# Patient Record
Sex: Male | Born: 1960 | Race: White | Hispanic: No | Marital: Married | Smoking: Never smoker
Health system: Southern US, Community
[De-identification: ages and names within clinical notes are randomized; demographics above are authoritative.]

## PROBLEM LIST (undated history)

## (undated) DIAGNOSIS — R748 Abnormal levels of other serum enzymes: Secondary | ICD-10-CM

## (undated) DIAGNOSIS — C801 Malignant (primary) neoplasm, unspecified: Secondary | ICD-10-CM

## (undated) DIAGNOSIS — K219 Gastro-esophageal reflux disease without esophagitis: Secondary | ICD-10-CM

## (undated) DIAGNOSIS — Z86010 Personal history of colonic polyps: Secondary | ICD-10-CM

## (undated) DIAGNOSIS — Q859 Phakomatosis, unspecified: Secondary | ICD-10-CM

## (undated) DIAGNOSIS — J45909 Unspecified asthma, uncomplicated: Secondary | ICD-10-CM

## (undated) DIAGNOSIS — D131 Benign neoplasm of stomach: Secondary | ICD-10-CM

## (undated) HISTORY — PX: POLYPECTOMY: SHX149

## (undated) HISTORY — DX: Unspecified asthma, uncomplicated: J45.909

## (undated) HISTORY — DX: Malignant (primary) neoplasm, unspecified: C80.1

## (undated) HISTORY — PX: UPPER GASTROINTESTINAL ENDOSCOPY: SHX188

## (undated) HISTORY — DX: Gastro-esophageal reflux disease without esophagitis: K21.9

## (undated) HISTORY — DX: Personal history of colonic polyps: Z86.010

## (undated) HISTORY — DX: Phakomatosis, unspecified: Q85.9

## (undated) HISTORY — PX: COLONOSCOPY: SHX174

## (undated) HISTORY — DX: Abnormal levels of other serum enzymes: R74.8

## (undated) HISTORY — DX: Benign neoplasm of stomach: D13.1

---

## 2016-04-24 ENCOUNTER — Encounter: Payer: Self-pay | Admitting: Internal Medicine

## 2016-06-19 ENCOUNTER — Ambulatory Visit (AMBULATORY_SURGERY_CENTER): Payer: Self-pay | Admitting: *Deleted

## 2016-06-19 VITALS — Ht 67.0 in | Wt 187.0 lb

## 2016-06-19 DIAGNOSIS — Z1211 Encounter for screening for malignant neoplasm of colon: Secondary | ICD-10-CM

## 2016-06-19 NOTE — Progress Notes (Signed)
No egg or soy allergy known to patient  No issues with past sedation with any surgeries  or procedures, no intubation problems  No diet pills per patient No home 02 use per patient  No blood thinners per patient  Pt denies issues with constipation  No A fib or A flutter  EMMI video sent to pt's e mail  

## 2016-06-23 ENCOUNTER — Encounter: Payer: Self-pay | Admitting: Internal Medicine

## 2016-06-24 ENCOUNTER — Encounter: Payer: Self-pay | Admitting: Internal Medicine

## 2016-07-03 ENCOUNTER — Encounter: Payer: Self-pay | Admitting: Internal Medicine

## 2016-08-21 ENCOUNTER — Encounter: Payer: BLUE CROSS/BLUE SHIELD | Admitting: Internal Medicine

## 2016-10-01 ENCOUNTER — Ambulatory Visit (AMBULATORY_SURGERY_CENTER): Payer: BLUE CROSS/BLUE SHIELD | Admitting: Internal Medicine

## 2016-10-01 ENCOUNTER — Encounter: Payer: Self-pay | Admitting: Internal Medicine

## 2016-10-01 VITALS — BP 120/84 | HR 59 | Temp 98.0°F | Resp 12 | Ht 67.0 in | Wt 187.0 lb

## 2016-10-01 DIAGNOSIS — D123 Benign neoplasm of transverse colon: Secondary | ICD-10-CM

## 2016-10-01 DIAGNOSIS — D122 Benign neoplasm of ascending colon: Secondary | ICD-10-CM | POA: Diagnosis not present

## 2016-10-01 DIAGNOSIS — D124 Benign neoplasm of descending colon: Secondary | ICD-10-CM

## 2016-10-01 DIAGNOSIS — D125 Benign neoplasm of sigmoid colon: Secondary | ICD-10-CM

## 2016-10-01 DIAGNOSIS — K635 Polyp of colon: Secondary | ICD-10-CM

## 2016-10-01 DIAGNOSIS — Z1212 Encounter for screening for malignant neoplasm of rectum: Secondary | ICD-10-CM

## 2016-10-01 DIAGNOSIS — D120 Benign neoplasm of cecum: Secondary | ICD-10-CM | POA: Diagnosis not present

## 2016-10-01 DIAGNOSIS — Z1211 Encounter for screening for malignant neoplasm of colon: Secondary | ICD-10-CM | POA: Diagnosis not present

## 2016-10-01 DIAGNOSIS — Z860101 Personal history of adenomatous and serrated colon polyps: Secondary | ICD-10-CM

## 2016-10-01 DIAGNOSIS — Z86010 Personal history of colonic polyps: Secondary | ICD-10-CM

## 2016-10-01 HISTORY — DX: Personal history of colonic polyps: Z86.010

## 2016-10-01 HISTORY — DX: Personal history of adenomatous and serrated colon polyps: Z86.0101

## 2016-10-01 MED ORDER — SODIUM CHLORIDE 0.9 % IV SOLN: 500.0000 mL | INTRAVENOUS | Status: DC

## 2016-10-01 NOTE — Progress Notes (Signed)
A/ox3 pleased with MAC, report to [facility]

## 2016-10-01 NOTE — Op Note (Signed)
Ray Shelton: Ray Shelton Procedure Date: 10/01/2016 2:47 PM MRN: [id number] Endoscopist: Gatha Mayer , MD Age: 56 Referring MD:  Date of Birth: 11/08/1960 Gender: Male Account #: [id number] Procedure:                Colonoscopy Indications:              Screening for colorectal malignant neoplasm, This                            is the patient's first colonoscopy Medicines:                Propofol per Anesthesia, Monitored Anesthesia Care Procedure:                Pre-Anesthesia Assessment:                           - Prior to the procedure, a History and Physical                            was performed, and patient medications and                            allergies were reviewed. The patient's tolerance of                            previous anesthesia was also reviewed. The risks                            and benefits of the procedure and the sedation                            options and risks were discussed with the patient.                            All questions were answered, and informed consent                            was obtained. Prior Anticoagulants: The patient has                            taken no previous anticoagulant or antiplatelet                            agents. ASA Grade Assessment: II - A patient with                            mild systemic disease. After reviewing the risks                            and benefits, the patient was deemed in                            satisfactory condition to undergo the procedure.  After obtaining informed consent, the colonoscope                            was passed under direct vision. Throughout the                            procedure, the patient's blood pressure, pulse, and                            oxygen saturations were monitored continuously. The                            Colonoscope was introduced through the anus and   advanced to the the cecum, identified by                            appendiceal orifice and ileocecal valve. The                            colonoscopy was performed without difficulty. The                            patient tolerated the procedure well. The quality                            of the bowel preparation was excellent. The                            ileocecal valve, appendiceal orifice, and rectum                            were photographed. Scope In: 2:52:15 PM Scope Out: 3:15:48 PM Scope Withdrawal Time: 0 hours 20 minutes 24 seconds  Total Procedure Duration: 0 hours 23 minutes 33 seconds  Findings:                 The perianal and digital rectal examinations were                            normal. Pertinent negatives include normal prostate                            (size, shape, and consistency).                           A large polyp was found in the cecum. The polyp was                            sessile and Paris classification Is (protruding,                            sessile). Biopsies were taken with a cold forceps                            for histology. Verification of patient  identification for the specimen was done. Estimated                            blood loss was minimal.                           Two semi-pedunculated polyps were found in the                            sigmoid colon and transverse colon. The polyps were                            10 mm in size. These polyps were removed with a hot                            snare. Resection and retrieval were complete.                            Verification of patient identification for the                            specimen was done. Estimated blood loss: none.                           Six sessile polyps were found in the sigmoid colon,                            descending colon, transverse colon and ascending                            colon. The polyps were 3 to 8 mm  in size. These                            polyps were removed with a cold snare. Resection                            and retrieval were complete. Verification of                            patient identification for the specimen was done.                            Estimated blood loss was minimal.                           Multiple diverticula were found in the left colon.                           External and internal hemorrhoids were found during                            retroflexion. Complications:            No immediate complications. Estimated Blood Loss:  Estimated blood loss was minimal. Impression:               - One large polyp in the cecum. Biopsied. Photo                            omitted - 1/2 circumference - soft - slight erosion                            - appears benign but large                           - Two 10 mm polyps in the sigmoid colon and in the                            transverse colon, removed with a hot snare.                            Resected and retrieved.                           - Six 3 to 8 mm polyps in the sigmoid colon, in the                            descending colon, in the transverse colon and in                            the ascending colon, removed with a cold snare.                            Resected and retrieved.                           - Diverticulosis in the left colon.                           - External and internal hemorrhoids. Recommendation:           - Patient has a contact number available for                            emergencies. The signs and symptoms of potential                            delayed complications were discussed with the                            patient. Return to normal activities tomorrow.                            Written discharge instructions were provided to the                            patient.                           -  Resume previous diet.                           - Continue  present medications.                           - No aspirin, ibuprofen, naproxen, or other                            non-steroidal anti-inflammatory drugs for 2 weeks                            after polyp removal.                           Unless patholpgy tells otherwise i think reasonable                            to consider colonoscopic polypectomy of the largest                            polyp - cecum                           - Repeat colonoscopy is recommended. The                            colonoscopy date will be determined after pathology                            results from today's exam become available for                            review. Gatha Mayer, MD 10/01/2016 3:34:04 PM This report has been signed electronically.

## 2016-10-01 NOTE — Patient Instructions (Addendum)
I found 9 polyps - removed 8.  One is quite large - I took biopsies. I think it is benign - took some biopsies. It needs to come out - ? Is through surgery or by colonoscopy. I am thinking (unless biopsies say otherwise) that you should try to have it removed using the colonoscope. When biopsies are in I will call and explain things more.  You also have a condition called diverticulosis - common and not usually a problem. Please read the handout provided.  I appreciate the opportunity to care for you. Gatha Mayer, MD, [facility]   Discharge instructions given. Handouts on polyps,diverticulosis and hemorrhoids. Resume previous medications. No ibuprofen, naproxen, or other NSAIDs for two weeks. YOU HAD AN ENDOSCOPIC PROCEDURE TODAY AT [facility]:   Refer to the procedure report that was given to you for any specific questions about what was found during the examination.  If the procedure report does not answer your questions, please call your gastroenterologist to clarify.  If you requested that your care partner not be given the details of your procedure findings, then the procedure report has been included in a sealed envelope for you to review at your convenience later.  YOU SHOULD EXPECT: Some feelings of bloating in the abdomen. Passage of more gas than usual.  Walking can help get rid of the air that was put into your GI tract during the procedure and reduce the bloating. If you had a lower endoscopy (such as a colonoscopy or flexible sigmoidoscopy) you may notice spotting of blood in your stool or on the toilet paper. If you underwent a bowel prep for your procedure, you may not have a normal bowel movement for a few days.  Please Note:  You might notice some irritation and congestion in your nose or some drainage.  This is from the oxygen used during your procedure.  There is no need for concern and it should clear up in a day or so.  SYMPTOMS TO REPORT  IMMEDIATELY:   Following lower endoscopy (colonoscopy or flexible sigmoidoscopy):  Excessive amounts of blood in the stool  Significant tenderness or worsening of abdominal pains  Swelling of the abdomen that is new, acute  Fever of 100F or higher   For urgent or emergent issues, a gastroenterologist can be reached at any hour by calling [phone].   DIET:  We do recommend a small meal at first, but then you may proceed to your regular diet.  Drink plenty of fluids but you should avoid alcoholic beverages for 24 hours.  ACTIVITY:  You should plan to take it easy for the rest of today and you should NOT DRIVE or use heavy machinery until tomorrow (because of the sedation medicines used during the test).    FOLLOW UP: Our staff will call the number listed on your records the next business day following your procedure to check on you and address any questions or concerns that you may have regarding the information given to you following your procedure. If we do not reach you, we will leave a message.  However, if you are feeling well and you are not experiencing any problems, there is no need to return our call.  We will assume that you have returned to your regular daily activities without incident.  If any biopsies were taken you will be contacted by phone or by letter within the next 1-3 weeks.  Please call us at [phone] if you have  not heard about the biopsies in 3 weeks.    SIGNATURES/CONFIDENTIALITY: You and/or your care partner have signed paperwork which will be entered into your electronic medical record.  These signatures attest to the fact that that the information above on your After Visit Summary has been reviewed and is understood.  Full responsibility of the confidentiality of this discharge information lies with you and/or your care-partner.

## 2016-10-01 NOTE — Progress Notes (Signed)
Called to room to assist during endoscopic procedure.  Patient ID and intended procedure confirmed with present staff. Received instructions for my participation in the procedure from the performing physician.  

## 2016-10-02 ENCOUNTER — Telehealth: Payer: Self-pay | Admitting: *Deleted

## 2016-10-02 NOTE — Telephone Encounter (Signed)
  Follow up Call-  Call back number 10/01/2016  Post procedure Call Back phone  # [phone]  Permission to leave phone message Yes     Patient questions:  Do you have a fever, pain , or abdominal swelling? No. Pain Score  0 *  Have you tolerated food without any problems? Yes.    Have you been able to return to your normal activities? Yes.    Do you have any questions about your discharge instructions: Diet   No. Medications  No. Follow up visit  No.  Do you have questions or concerns about your Care? No.  Actions: * If pain score is 4 or above: No action needed, pain <4.

## 2016-10-10 ENCOUNTER — Telehealth: Payer: Self-pay | Admitting: Internal Medicine

## 2016-10-10 NOTE — Telephone Encounter (Signed)
Patient and wife called back to discuss pathology results and again.  All questions answered.   They have no surgical preference.  Please advise

## 2016-10-10 NOTE — Telephone Encounter (Signed)
Referral faxed to [facility].  Patient aware that they will contact him directly

## 2016-10-10 NOTE — Telephone Encounter (Signed)
Refer to Dr. Greer Pickerel please

## 2016-10-10 NOTE — Telephone Encounter (Signed)
Patient calling back to discuss results from colon on 8.22.18.

## 2016-10-14 NOTE — Telephone Encounter (Signed)
Patient has been scheduled with Dr. Redmond Pulling for 10/31/16 9:00

## 2016-10-31 NOTE — Patient Instructions (Addendum)
Ray Shelton.  10/31/2016   Your procedure is scheduled on: 11/04/2016    Report to [facility] Main  Entrance Take [place]  elevators to 3rd floor to  [place] at    0800 AM.     Call this number if you have problems the morning of surgery [phone]    Remember: ONLY 1 PERSON MAY GO WITH YOU TO SHORT STAY TO GET  READY MORNING OF [place].  Do not eat food or drink liquids :After Midnight.     Take these medicines the morning of surgery with A SIP OF WATER: prevacid                                 You may not have any metal on your body including hair pins and              piercings  Do not wear jewelry,  lotions, powders or perfumes, deodorant                          Men may shave face and neck.   Do not bring valuables to the hospital. [place].  Contacts, dentures or bridgework may not be worn into surgery.  Leave suitcase in the car. After surgery it may be brought to your room.                       Please read over the following fact sheets you were given: _____________________________________________________________________             [facility] - Preparing for Surgery Before surgery, you can play an important role.  Because skin is not sterile, your skin needs to be as free of germs as possible.  You can reduce the number of germs on your skin by washing with CHG (chlorahexidine gluconate) soap before surgery.  CHG is an antiseptic cleaner which kills germs and bonds with the skin to continue killing germs even after washing. Please DO NOT use if you have an allergy to CHG or antibacterial soaps.  If your skin becomes reddened/irritated stop using the CHG and inform your nurse when you arrive at Short Stay. Do not shave (including legs and underarms) for at least 48 hours prior to the first CHG shower.  You may shave your face/neck. Please follow these instructions  carefully:  1.  Shower with CHG Soap the night before surgery and the  morning of Surgery.  2.  If you choose to wash your hair, wash your hair first as usual with your  normal  shampoo.  3.  After you shampoo, rinse your hair and body thoroughly to remove the  shampoo.                           4.  Use CHG as you would any other liquid soap.  You can apply chg directly  to the skin and wash                       Gently with a scrungie or clean washcloth.  5.  Apply the CHG  Soap to your body ONLY FROM THE NECK DOWN.   Do not use on face/ open                           Wound or open sores. Avoid contact with eyes, ears mouth and genitals (private parts).                       Wash face,  Genitals (private parts) with your normal soap.             6.  Wash thoroughly, paying special attention to the area where your surgery  will be performed.  7.  Thoroughly rinse your body with warm water from the neck down.  8.  DO NOT shower/wash with your normal soap after using and rinsing off  the CHG Soap.                9.  Pat yourself dry with a clean towel.            10.  Wear clean pajamas.            11.  Place clean sheets on your bed the night of your first shower and do not  sleep with pets. Day of Surgery : Do not apply any lotions/deodorants the morning of surgery.  Please wear clean clothes to the hospital/surgery center.  FAILURE TO FOLLOW THESE INSTRUCTIONS MAY RESULT IN THE CANCELLATION OF YOUR SURGERY PATIENT SIGNATURE_________________________________  NURSE SIGNATURE__________________________________  ________________________________________________________________________

## 2016-11-03 ENCOUNTER — Encounter (HOSPITAL_COMMUNITY)
Admission: RE | Admit: 2016-11-03 | Discharge: 2016-11-03 | Disposition: A | Discharge status: Home / Self Care | Payer: BLUE CROSS/BLUE SHIELD | Source: Ambulatory Visit | Attending: General Surgery | Admitting: General Surgery

## 2016-11-03 ENCOUNTER — Encounter (HOSPITAL_COMMUNITY): Payer: Self-pay

## 2016-11-03 ENCOUNTER — Encounter (INDEPENDENT_AMBULATORY_CARE_PROVIDER_SITE_OTHER): Payer: Self-pay

## 2016-11-03 ENCOUNTER — Ambulatory Visit: Payer: Self-pay | Admitting: General Surgery

## 2016-11-03 DIAGNOSIS — Z01812 Encounter for preprocedural laboratory examination: Secondary | ICD-10-CM

## 2016-11-03 LAB — DIFFERENTIAL
Basophils Absolute: 0 10*3/uL (ref 0.0–0.1)
Basophils Relative: 1 %
EOS PCT: 2 %
Eosinophils Absolute: 0.1 10*3/uL (ref 0.0–0.7)
LYMPHS PCT: 30 %
Lymphs Abs: 2.5 10*3/uL (ref 0.7–4.0)
MONO ABS: 0.6 10*3/uL (ref 0.1–1.0)
Monocytes Relative: 8 %
NEUTROS ABS: 5.1 10*3/uL (ref 1.7–7.7)
Neutrophils Relative %: 61 %

## 2016-11-03 LAB — CBC
HEMATOCRIT: 49.6 % (ref 39.0–52.0)
HEMOGLOBIN: 16.7 g/dL (ref 13.0–17.0)
MCH: 30.3 pg (ref 26.0–34.0)
MCHC: 33.7 g/dL (ref 30.0–36.0)
MCV: 90 fL (ref 78.0–100.0)
Platelets: 263 10*3/uL (ref 150–400)
RBC: 5.51 MIL/uL (ref 4.22–5.81)
RDW: 13.5 % (ref 11.5–15.5)
WBC: 8.9 10*3/uL (ref 4.0–10.5)

## 2016-11-03 LAB — ABO/RH: ABO/RH(D): A POS

## 2016-11-03 NOTE — Progress Notes (Signed)
05/28/16-LOV- DR Orpah Greek on chart  EKG-05/28/16-on chart- Dr Orpah Greek  01/24/16-Stress Test - on chart- Dr Sabra Heck  ECHO-01/24/16- Dr Sabra Heck on chart

## 2016-11-03 NOTE — H&P (Signed)
Ray Shelton. 10/31/2016 9:07 AM Location: [facility] Patient #: [id number] DOB: 18-Mar-1960 Married / Language: [name] / Race: White Male   History of Present Illness Ray Hiss M. Ray Girten MD; 10/31/2016 10:55 AM) The patient is a 57 year old male who presents with a colonic polyp. He is referred by Dr Carlean Purl for evaluation of a cecal polyp. He states that he was undergoing routine screening colonoscopy around the end of August and he was found to have multiple polyps. All of which were able to be removed except for one in the beginning portion of his colon. All of the polyps were tubular adenomas with the exception of a cecal polyp mass. He was found to have a sessile polyp. 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 He is accompanied by his wife. He denies any abdominal pain. He denies any nausea, vomiting, diarrhea or constipation. He denies any melena or hematochezia. He denies any him plan weight loss. He denies any family history of cancers. He denies any chest pain, chest pressure, shortness of breath, orthopnea, paroxysmal nocturnal dyspnea, TIAs or amaurosis fugax. He denies smoking. He does drink about 2 beers per day.   Problem List/Past Medical Ray Hiss M. 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 Redmond Pulling, MD; 10/31/2016 10:56 AM) [name], Oral) Active. Medications Reconciled Neomycin Sulfate (500MG  Tablet, 2 (two) Tablet Oral SEE NOTE, Taken starting 10/31/2016) Active. (TAKE TWO TABLETS AT 2 PM, 3 PM, AND 10 PM THE DAY PRIOR TO SURGERY) [name], 2 (two) Tablet Oral SEE NOTE, Taken starting 10/31/2016) Active. (Take at 2pm, 3pm, and 10pm the day prior to your colon operation)  Social History Ray Shelton, RMA; 10/31/2016 9:07 AM) Alcohol use  Moderate alcohol use. Caffeine use  Coffee. No drug use  Tobacco use  Never smoker.  Family History Ray Shelton, [place]; 10/31/2016 9:07 AM) Alcohol Abuse  Son. Diabetes Mellitus  Mother. Heart Disease  Father. Prostate Cancer  Father.  Other Problems Ray Hiss M. Redmond Pulling, MD; 10/31/2016 10:56 AM) Gastroesophageal Reflux Disease     Review of Systems Ray Shelton RMA; 10/31/2016 9:07 AM) General Not Present- Appetite Loss, Chills, Fatigue, Fever, Night Sweats, Weight Gain and Weight Loss. Skin Not Present- Change in Wart/Mole, Dryness, Hives, Jaundice, New Lesions, Non-Healing Wounds, Rash and Ulcer. HEENT Present- Ringing in the Ears and Wears glasses/contact lenses. Not Present- Earache, Hearing Loss, Hoarseness, Nose Bleed, Oral Ulcers, Seasonal Allergies, Sinus Pain, Sore Throat, Visual Disturbances and Yellow Eyes. Respiratory Present- Snoring. Not Present- Bloody sputum, Chronic Cough, Difficulty Breathing and Wheezing. Breast Not Present- Breast Mass, Breast Pain, Nipple Discharge and Skin Changes. Cardiovascular Not Present- Chest Pain, Difficulty Breathing Lying Down, Leg Cramps, Palpitations, Rapid Heart Rate, Shortness of Breath and Swelling of Extremities. Gastrointestinal Not Present- Abdominal Pain, Bloating, Bloody Stool, Change in Bowel Habits, Chronic diarrhea, Constipation, Difficulty Swallowing, Excessive gas, Gets full quickly at meals, Hemorrhoids, Indigestion, Nausea, Rectal Pain and Vomiting. Male Genitourinary Not Present- Blood in Urine, Change in Urinary Stream, Frequency, Impotence, Nocturia, Painful Urination, Urgency and Urine Leakage. Musculoskeletal Not Present- Back Pain, Joint Pain, Joint Stiffness, Muscle Pain, Muscle Weakness and  Swelling of Extremities. Neurological Not  Present- Decreased Memory, Fainting, Headaches, Numbness, Seizures, Tingling, Tremor, Trouble walking and Weakness. Psychiatric Not Present- Anxiety, Bipolar, Change in Sleep Pattern, Depression, Fearful and Frequent crying. Endocrine Not Present- Cold Intolerance, Excessive Hunger, Hair Changes, Heat Intolerance, Hot flashes and New Diabetes. Hematology Not Present- Blood Thinners, Easy Bruising, Excessive bleeding, Gland problems, HIV and Persistent Infections.  Vitals Ray Shelton RMA; 10/31/2016 9:09 AM) 10/31/2016 9:08 AM Weight: 188.8 lb Height: 67in Body Surface Area: 1.97 m Body Mass Index: 29.57 kg/m  Temp.: 97.36F  Pulse: 69 (Regular)  BP: 130/90 (Sitting, Left Arm, Standard)       Physical Exam Ray Hiss M. Donneisha Beane MD; 10/31/2016 10:53 AM) General Mental Status-Alert. General Appearance-Consistent with stated age. Hydration-Well hydrated. Voice-Normal. Note: overweight   Head and Neck Head-normocephalic, atraumatic with no lesions or palpable masses. Trachea-midline. Thyroid Gland Characteristics - normal size and consistency.  Eye Eyeball - Bilateral-Extraocular movements intact. Sclera/Conjunctiva - Bilateral-No scleral icterus.  ENMT Ears -Note: normal external ears.  Mouth and Throat -Note: lips intact.   Chest and Lung Exam Chest and lung exam reveals -quiet, even and easy respiratory effort with no use of accessory muscles and on auscultation, normal breath sounds, no adventitious sounds and normal vocal resonance. Inspection Chest Wall - Normal. Back - normal.  Breast - Did not examine.  Cardiovascular Cardiovascular examination reveals -normal heart sounds, regular rate and rhythm with no murmurs and normal pedal pulses bilaterally.  Abdomen Inspection Inspection of the abdomen reveals - No Hernias. Skin - Scar - no surgical scars. Palpation/Percussion Palpation  and Percussion of the abdomen reveal - Soft, Non Tender, No Rebound tenderness, No Rigidity (guarding) and No hepatosplenomegaly. Auscultation Auscultation of the abdomen reveals - Bowel sounds normal.  Peripheral Vascular Upper Extremity Palpation - Pulses bilaterally normal.  Neurologic Neurologic evaluation reveals -alert and oriented x 3 with no impairment of recent or remote memory. Mental Status-Normal.  Neuropsychiatric The patient's mood and affect are described as -normal. Judgment and Insight-insight is appropriate concerning matters relevant to self.  Musculoskeletal Normal Exam - Left-Upper Extremity Strength Normal and Lower Extremity Strength Normal. Normal Exam - Right-Upper Extremity Strength Normal and Lower Extremity Strength Normal.  Lymphatic Head & Neck  General Head & Neck Lymphatics: Bilateral - Description - Normal. Axillary - Did not examine. Femoral & Inguinal - Did not examine.    Assessment & Plan Ray Hiss M. 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 I agree with Dr. Carlean Purl that he needs dissection of his colon removed based on the pathology report. We discussed colonic polyps and their progression to malignancy. We discussed that there may be in fact some cancer in this polyp but cannot say for sure until it is completely surgically removed.  I recommended a laparoscopic partial colectomy.  I discussed the procedure in detail. The patient was given Neurosurgeon. We discussed the risks and benefits of surgery including, but not limited to bleeding, infection (such as wound infection, abdominal abscess), injury to surrounding structures, blood clot formation, urinary retention, incisional hernia, anastomotic stricture, anastomotic leak, anesthesia risks, pulmonary & cardiac complications such as pneumonia &/or heart attack, need for additional  procedures, ileus, & prolonged hospitalization. We discussed the typical postoperative recovery course, including limitations & restrictions postoperatively. I explained that the likelihood of improvement in their symptoms is good.  The patient has elected to proceed with surgery.  ERAS protocol Current Plans Pt Education - Pamphlet Given - Laparoscopic  Colorectal Surgery: discussed with patient and provided information. Started Neomycin Sulfate 500MG , 2 (two) Tablet SEE NOTE, #6, 10/31/2016, No Refill. Local Order: TAKE TWO TABLETS AT 2 PM, 3 PM, AND 10 PM THE DAY PRIOR TO SURGERY Started Flagyl 500MG , 2 (two) Tablet SEE NOTE, #6, 10/31/2016, No Refill. Local Order: Take at 2pm, 3pm, and 10pm the day prior to your colon operation   Leighton Ruff. Redmond Pulling, MD, FACS General, Bariatric, & Minimally Invasive Surgery [facility], [place]

## 2016-11-04 ENCOUNTER — Encounter (HOSPITAL_COMMUNITY): Payer: Self-pay | Admitting: Registered Nurse

## 2016-11-04 ENCOUNTER — Inpatient Hospital Stay (HOSPITAL_COMMUNITY): Payer: BLUE CROSS/BLUE SHIELD | Admitting: Registered Nurse

## 2016-11-04 ENCOUNTER — Encounter (HOSPITAL_COMMUNITY)
Admission: RE | Disposition: A | Discharge status: Home / Self Care | Payer: Self-pay | Source: Ambulatory Visit | Attending: General Surgery

## 2016-11-04 ENCOUNTER — Inpatient Hospital Stay (HOSPITAL_COMMUNITY)
Admission: RE | Admit: 2016-11-04 | Discharge: 2016-11-07 | DRG: 331 | Disposition: A | Discharge status: Home / Self Care | Payer: BLUE CROSS/BLUE SHIELD | Source: Ambulatory Visit | Attending: General Surgery | Admitting: General Surgery

## 2016-11-04 DIAGNOSIS — K219 Gastro-esophageal reflux disease without esophagitis: Secondary | ICD-10-CM | POA: Diagnosis present

## 2016-11-04 DIAGNOSIS — Z811 Family history of alcohol abuse and dependence: Secondary | ICD-10-CM

## 2016-11-04 DIAGNOSIS — Z8249 Family history of ischemic heart disease and other diseases of the circulatory system: Secondary | ICD-10-CM | POA: Diagnosis not present

## 2016-11-04 DIAGNOSIS — E663 Overweight: Secondary | ICD-10-CM | POA: Diagnosis present

## 2016-11-04 DIAGNOSIS — Z833 Family history of diabetes mellitus: Secondary | ICD-10-CM | POA: Diagnosis not present

## 2016-11-04 DIAGNOSIS — Z6829 Body mass index (BMI) 29.0-29.9, adult: Secondary | ICD-10-CM

## 2016-11-04 DIAGNOSIS — Z8042 Family history of malignant neoplasm of prostate: Secondary | ICD-10-CM

## 2016-11-04 DIAGNOSIS — K6389 Other specified diseases of intestine: Secondary | ICD-10-CM | POA: Diagnosis present

## 2016-11-04 DIAGNOSIS — K635 Polyp of colon: Principal | ICD-10-CM | POA: Diagnosis present

## 2016-11-04 HISTORY — PX: LAPAROSCOPIC PARTIAL COLECTOMY: SHX5907

## 2016-11-04 LAB — COMPREHENSIVE METABOLIC PANEL
ALK PHOS: 41 U/L (ref 38–126)
ALT: 49 U/L (ref 17–63)
ANION GAP: 12 (ref 5–15)
AST: 44 U/L — ABNORMAL HIGH (ref 15–41)
Albumin: 4.7 g/dL (ref 3.5–5.0)
BUN: 13 mg/dL (ref 6–20)
CALCIUM: 9.6 mg/dL (ref 8.9–10.3)
CO2: 26 mmol/L (ref 22–32)
CREATININE: 1.14 mg/dL (ref 0.61–1.24)
Chloride: 99 mmol/L — ABNORMAL LOW (ref 101–111)
Glucose, Bld: 105 mg/dL — ABNORMAL HIGH (ref 65–99)
Potassium: 4.5 mmol/L (ref 3.5–5.1)
Sodium: 137 mmol/L (ref 135–145)
TOTAL PROTEIN: 8.2 g/dL — AB (ref 6.5–8.1)
Total Bilirubin: 1.3 mg/dL — ABNORMAL HIGH (ref 0.3–1.2)

## 2016-11-04 LAB — TYPE AND SCREEN
ABO/RH(D): A POS
Antibody Screen: NEGATIVE

## 2016-11-04 LAB — HEMOGLOBIN A1C
HEMOGLOBIN A1C: 5.6 % (ref 4.8–5.6)
Mean Plasma Glucose: 114.02 mg/dL

## 2016-11-04 SURGERY — LAPAROSCOPIC PARTIAL COLECTOMY
Anesthesia: General | Site: Abdomen

## 2016-11-04 MED ORDER — SUGAMMADEX SODIUM 200 MG/2ML IV SOLN
INTRAVENOUS | Status: AC
  Filled 2016-11-04: qty 2

## 2016-11-04 MED ORDER — BUPIVACAINE LIPOSOME 1.3 % IJ SUSP
20.0000 mL | Freq: Once | INTRAMUSCULAR | Status: AC
  Administered 2016-11-04: 20 mL
  Filled 2016-11-04: qty 20

## 2016-11-04 MED ORDER — PROMETHAZINE HCL 25 MG/ML IJ SOLN
12.5000 mg | Freq: Four times a day (QID) | INTRAMUSCULAR | Status: DC | PRN
  Administered 2016-11-04: 12.5 mg via INTRAVENOUS
  Filled 2016-11-04: qty 1

## 2016-11-04 MED ORDER — CHLORHEXIDINE GLUCONATE 4 % EX LIQD: 60.0000 mL | Freq: Once | CUTANEOUS | Status: DC

## 2016-11-04 MED ORDER — GABAPENTIN 300 MG PO CAPS
300.0000 mg | ORAL_CAPSULE | ORAL | Status: AC
Start: 2016-11-04 — End: 2016-11-04
  Administered 2016-11-04: 300 mg via ORAL
  Filled 2016-11-04: qty 1

## 2016-11-04 MED ORDER — ONDANSETRON HCL 4 MG/2ML IJ SOLN
INTRAMUSCULAR | Status: AC
  Filled 2016-11-04: qty 2

## 2016-11-04 MED ORDER — PROPOFOL 10 MG/ML IV BOLUS
INTRAVENOUS | Status: DC | PRN
  Administered 2016-11-04: 160 mg via INTRAVENOUS

## 2016-11-04 MED ORDER — BUPIVACAINE-EPINEPHRINE 0.25% -1:200000 IJ SOLN
INTRAMUSCULAR | Status: DC | PRN
  Administered 2016-11-04: 30 mL

## 2016-11-04 MED ORDER — LIDOCAINE 2% (20 MG/ML) 5 ML SYRINGE
INTRAMUSCULAR | Status: DC | PRN
  Administered 2016-11-04: 60 mg via INTRAVENOUS

## 2016-11-04 MED ORDER — ONDANSETRON HCL 4 MG/2ML IJ SOLN
INTRAMUSCULAR | Status: DC | PRN
  Administered 2016-11-04: 4 mg via INTRAVENOUS

## 2016-11-04 MED ORDER — DEXAMETHASONE SODIUM PHOSPHATE 10 MG/ML IJ SOLN
INTRAMUSCULAR | Status: DC | PRN
  Administered 2016-11-04: 8 mg via INTRAVENOUS

## 2016-11-04 MED ORDER — ONDANSETRON HCL 4 MG/2ML IJ SOLN: 4.0000 mg | Freq: Four times a day (QID) | INTRAMUSCULAR | Status: DC | PRN

## 2016-11-04 MED ORDER — PROPOFOL 10 MG/ML IV BOLUS
INTRAVENOUS | Status: AC
  Filled 2016-11-04: qty 20

## 2016-11-04 MED ORDER — KETAMINE HCL 10 MG/ML IJ SOLN
INTRAMUSCULAR | Status: DC | PRN
  Administered 2016-11-04: 35 mg via INTRAVENOUS
  Administered 2016-11-04 (×2): 10 mg via INTRAVENOUS

## 2016-11-04 MED ORDER — MORPHINE SULFATE (PF) 4 MG/ML IV SOLN
1.0000 mg | INTRAVENOUS | Status: DC | PRN
  Administered 2016-11-04: 2 mg via INTRAVENOUS
  Administered 2016-11-05 – 2016-11-06 (×3): 3 mg via INTRAVENOUS
  Filled 2016-11-04 (×4): qty 1

## 2016-11-04 MED ORDER — LIDOCAINE 2% (20 MG/ML) 5 ML SYRINGE
INTRAMUSCULAR | Status: AC
  Filled 2016-11-04: qty 5

## 2016-11-04 MED ORDER — SCOPOLAMINE 1 MG/3DAYS TD PT72
1.0000 | MEDICATED_PATCH | TRANSDERMAL | Status: DC
  Administered 2016-11-04: 1.5 mg via TRANSDERMAL
  Filled 2016-11-04: qty 1

## 2016-11-04 MED ORDER — PROMETHAZINE HCL 25 MG/ML IJ SOLN: 6.2500 mg | INTRAMUSCULAR | Status: DC | PRN

## 2016-11-04 MED ORDER — GABAPENTIN 300 MG PO CAPS
300.0000 mg | ORAL_CAPSULE | Freq: Two times a day (BID) | ORAL | Status: DC
  Administered 2016-11-04 – 2016-11-07 (×6): 300 mg via ORAL
  Filled 2016-11-04 (×6): qty 1

## 2016-11-04 MED ORDER — PANTOPRAZOLE SODIUM 40 MG IV SOLR
40.0000 mg | INTRAVENOUS | Status: DC
  Administered 2016-11-04 – 2016-11-05 (×2): 40 mg via INTRAVENOUS
  Filled 2016-11-04 (×2): qty 40

## 2016-11-04 MED ORDER — ROCURONIUM BROMIDE 50 MG/5ML IV SOSY
PREFILLED_SYRINGE | INTRAVENOUS | Status: AC
  Filled 2016-11-04: qty 5

## 2016-11-04 MED ORDER — MIDAZOLAM HCL 2 MG/2ML IJ SOLN
INTRAMUSCULAR | Status: AC
  Filled 2016-11-04: qty 2

## 2016-11-04 MED ORDER — DEXAMETHASONE SODIUM PHOSPHATE 10 MG/ML IJ SOLN
INTRAMUSCULAR | Status: AC
  Filled 2016-11-04: qty 1

## 2016-11-04 MED ORDER — LIDOCAINE HCL 2 % IJ SOLN
INTRAMUSCULAR | Status: AC
  Filled 2016-11-04: qty 40

## 2016-11-04 MED ORDER — SODIUM CHLORIDE 0.9 % IJ SOLN
INTRAMUSCULAR | Status: AC
  Filled 2016-11-04: qty 50

## 2016-11-04 MED ORDER — CEFOTETAN DISODIUM-DEXTROSE 2-2.08 GM-% IV SOLR
2.0000 g | INTRAVENOUS | Status: AC
  Administered 2016-11-04: 2 g via INTRAVENOUS
  Filled 2016-11-04: qty 50

## 2016-11-04 MED ORDER — LIDOCAINE 2% (20 MG/ML) 5 ML SYRINGE
INTRAMUSCULAR | Status: DC | PRN
  Administered 2016-11-04: 1.5 mg/kg/h via INTRAVENOUS

## 2016-11-04 MED ORDER — FENTANYL CITRATE (PF) 250 MCG/5ML IJ SOLN
INTRAMUSCULAR | Status: AC
  Filled 2016-11-04: qty 5

## 2016-11-04 MED ORDER — ACETAMINOPHEN 500 MG PO TABS
1000.0000 mg | ORAL_TABLET | Freq: Four times a day (QID) | ORAL | Status: AC
  Administered 2016-11-04 – 2016-11-05 (×4): 1000 mg via ORAL
  Filled 2016-11-04 (×4): qty 2

## 2016-11-04 MED ORDER — KETAMINE HCL 10 MG/ML IJ SOLN
INTRAMUSCULAR | Status: AC
  Filled 2016-11-04: qty 1

## 2016-11-04 MED ORDER — MIDAZOLAM HCL 5 MG/5ML IJ SOLN
INTRAMUSCULAR | Status: DC | PRN
  Administered 2016-11-04: 2 mg via INTRAVENOUS

## 2016-11-04 MED ORDER — SUGAMMADEX SODIUM 200 MG/2ML IV SOLN
INTRAVENOUS | Status: DC | PRN
  Administered 2016-11-04: 200 mg via INTRAVENOUS

## 2016-11-04 MED ORDER — ONDANSETRON HCL 4 MG PO TABS: 4.0000 mg | ORAL_TABLET | Freq: Four times a day (QID) | ORAL | Status: DC | PRN

## 2016-11-04 MED ORDER — SODIUM CHLORIDE 0.9 % IJ SOLN
INTRAMUSCULAR | Status: DC | PRN
  Administered 2016-11-04: 50 mL via INTRAVENOUS

## 2016-11-04 MED ORDER — HYDROMORPHONE HCL-NACL 0.5-0.9 MG/ML-% IV SOSY
PREFILLED_SYRINGE | INTRAVENOUS | Status: AC
Start: 2016-11-04 — End: 2016-11-05
  Filled 2016-11-04: qty 3

## 2016-11-04 MED ORDER — LIDOCAINE 2% (20 MG/ML) 5 ML SYRINGE
INTRAMUSCULAR | Status: AC
  Filled 2016-11-04: qty 10

## 2016-11-04 MED ORDER — LABETALOL HCL 5 MG/ML IV SOLN
INTRAVENOUS | Status: AC
  Filled 2016-11-04: qty 4

## 2016-11-04 MED ORDER — ALVIMOPAN 12 MG PO CAPS
12.0000 mg | ORAL_CAPSULE | ORAL | Status: AC
  Administered 2016-11-04: 12 mg via ORAL
  Filled 2016-11-04: qty 1

## 2016-11-04 MED ORDER — KCL IN DEXTROSE-NACL 20-5-0.45 MEQ/L-%-% IV SOLN
INTRAVENOUS | Status: DC
  Administered 2016-11-04 – 2016-11-05 (×2): via INTRAVENOUS
  Filled 2016-11-04 (×3): qty 1000

## 2016-11-04 MED ORDER — ENOXAPARIN SODIUM 40 MG/0.4ML ~~LOC~~ SOLN
40.0000 mg | SUBCUTANEOUS | Status: DC
  Administered 2016-11-05 – 2016-11-07 (×2): 40 mg via SUBCUTANEOUS
  Filled 2016-11-04 (×3): qty 0.4

## 2016-11-04 MED ORDER — ACETAMINOPHEN 500 MG PO TABS
1000.0000 mg | ORAL_TABLET | ORAL | Status: AC
  Administered 2016-11-04: 1000 mg via ORAL
  Filled 2016-11-04: qty 2

## 2016-11-04 MED ORDER — OXYCODONE HCL 5 MG PO TABS
5.0000 mg | ORAL_TABLET | ORAL | Status: DC | PRN
  Administered 2016-11-07: 10 mg via ORAL
  Filled 2016-11-04: qty 2

## 2016-11-04 MED ORDER — 0.9 % SODIUM CHLORIDE (POUR BTL) OPTIME
TOPICAL | Status: DC | PRN
  Administered 2016-11-04: 4000 mL

## 2016-11-04 MED ORDER — DIPHENHYDRAMINE HCL 50 MG/ML IJ SOLN: 12.5000 mg | Freq: Four times a day (QID) | INTRAMUSCULAR | Status: DC | PRN

## 2016-11-04 MED ORDER — DIPHENHYDRAMINE HCL 12.5 MG/5ML PO ELIX: 12.5000 mg | ORAL_SOLUTION | Freq: Four times a day (QID) | ORAL | Status: DC | PRN

## 2016-11-04 MED ORDER — LACTATED RINGERS IV SOLN
INTRAVENOUS | Status: DC
  Administered 2016-11-04 (×3): via INTRAVENOUS

## 2016-11-04 MED ORDER — BUPIVACAINE-EPINEPHRINE (PF) 0.25% -1:200000 IJ SOLN
INTRAMUSCULAR | Status: AC
Start: 2016-11-04 — End: 2016-11-04
  Filled 2016-11-04: qty 30

## 2016-11-04 MED ORDER — LABETALOL HCL 5 MG/ML IV SOLN
INTRAVENOUS | Status: DC | PRN
  Administered 2016-11-04: 5 mg via INTRAVENOUS

## 2016-11-04 MED ORDER — ROCURONIUM BROMIDE 10 MG/ML (PF) SYRINGE
PREFILLED_SYRINGE | INTRAVENOUS | Status: DC | PRN
  Administered 2016-11-04: 50 mg via INTRAVENOUS
  Administered 2016-11-04 (×3): 20 mg via INTRAVENOUS
  Administered 2016-11-04: 10 mg via INTRAVENOUS

## 2016-11-04 MED ORDER — SODIUM CHLORIDE 0.9 % IV SOLN
INTRAVENOUS | Status: AC
  Filled 2016-11-04: qty 500000

## 2016-11-04 MED ORDER — ALVIMOPAN 12 MG PO CAPS
12.0000 mg | ORAL_CAPSULE | Freq: Two times a day (BID) | ORAL | Status: DC
  Administered 2016-11-05: 12 mg via ORAL
  Filled 2016-11-04: qty 1

## 2016-11-04 MED ORDER — HEPARIN SODIUM (PORCINE) 5000 UNIT/ML IJ SOLN
5000.0000 [IU] | Freq: Once | INTRAMUSCULAR | Status: AC
  Administered 2016-11-04: 5000 [IU] via SUBCUTANEOUS
  Filled 2016-11-04: qty 1

## 2016-11-04 MED ORDER — HYDROMORPHONE HCL-NACL 0.5-0.9 MG/ML-% IV SOSY
0.2500 mg | PREFILLED_SYRINGE | INTRAVENOUS | Status: DC | PRN
  Administered 2016-11-04 (×3): 0.5 mg via INTRAVENOUS

## 2016-11-04 MED ORDER — FENTANYL CITRATE (PF) 250 MCG/5ML IJ SOLN
INTRAMUSCULAR | Status: DC | PRN
  Administered 2016-11-04 (×2): 50 ug via INTRAVENOUS
  Administered 2016-11-04: 100 ug via INTRAVENOUS
  Administered 2016-11-04: 50 ug via INTRAVENOUS

## 2016-11-04 MED ORDER — LACTATED RINGERS IR SOLN
Status: DC | PRN
  Administered 2016-11-04: 2000 mL

## 2016-11-04 MED ORDER — SODIUM CHLORIDE 0.9 % IV SOLN
INTRAVENOUS | Status: DC | PRN
  Administered 2016-11-04: 500 mL

## 2016-11-04 SURGICAL SUPPLY — 63 items
APPLIER CLIP 5 13 M/L LIGAMAX5 (MISCELLANEOUS)
APPLIER CLIP ROT 10 11.4 M/L (STAPLE)
BLADE EXTENDED COATED 6.5IN (ELECTRODE) IMPLANT
CELLS DAT CNTRL 66122 CELL SVR (MISCELLANEOUS) IMPLANT
CHLORAPREP W/TINT 26ML (MISCELLANEOUS) IMPLANT
CLIP APPLIE 5 13 M/L LIGAMAX5 (MISCELLANEOUS) IMPLANT
CLIP APPLIE ROT 10 11.4 M/L (STAPLE) IMPLANT
COUNTER NEEDLE 20 DBL MAG RED (NEEDLE) IMPLANT
COVER MAYO STAND STRL (DRAPES) IMPLANT
DERMABOND ADVANCED (GAUZE/BANDAGES/DRESSINGS) ×2
DERMABOND ADVANCED .7 DNX12 (GAUZE/BANDAGES/DRESSINGS) ×1 IMPLANT
DRAIN CHANNEL 19F RND (DRAIN) IMPLANT
DRAPE LAPAROSCOPIC ABDOMINAL (DRAPES) ×3 IMPLANT
DRSG OPSITE POSTOP 4X6 (GAUZE/BANDAGES/DRESSINGS) ×3 IMPLANT
DRSG OPSITE POSTOP 4X8 (GAUZE/BANDAGES/DRESSINGS) IMPLANT
DRSG TELFA 3X8 NADH (GAUZE/BANDAGES/DRESSINGS) IMPLANT
ELECT PENCIL ROCKER SW 15FT (MISCELLANEOUS) ×3 IMPLANT
ELECT REM PT RETURN 15FT ADLT (MISCELLANEOUS) ×3 IMPLANT
EVACUATOR SILICONE 100CC (DRAIN) IMPLANT
GAUZE SPONGE 4X4 12PLY STRL (GAUZE/BANDAGES/DRESSINGS) IMPLANT
GLOVE BIO SURGEON STRL SZ7.5 (GLOVE) ×6 IMPLANT
GLOVE INDICATOR 8.0 STRL GRN (GLOVE) ×6 IMPLANT
GOWN STRL REUS W/TWL XL LVL3 (GOWN DISPOSABLE) ×24 IMPLANT
LEGGING LITHOTOMY PAIR STRL (DRAPES) IMPLANT
LIGASURE IMPACT 36 18CM CVD LR (INSTRUMENTS) ×3 IMPLANT
PACK COLON (CUSTOM PROCEDURE TRAY) ×3 IMPLANT
PAD POSITIONING PINK XL (MISCELLANEOUS) ×3 IMPLANT
PORT LAP GEL ALEXIS MED 5-9CM (MISCELLANEOUS) ×3 IMPLANT
POSITIONER SURGICAL ARM (MISCELLANEOUS) ×3 IMPLANT
RELOAD PROXIMATE 75MM BLUE (ENDOMECHANICALS) ×9 IMPLANT
RTRCTR WOUND ALEXIS 18CM MED (MISCELLANEOUS)
SEALER TISSUE X1 CVD JAW (INSTRUMENTS) IMPLANT
SEPRAFILM MEMBRANE 5X6 (MISCELLANEOUS) IMPLANT
SEPRAFILM PROCEDURAL PACK 3X5 (MISCELLANEOUS) IMPLANT
SET IRRIG TUBING LAPAROSCOPIC (IRRIGATION / IRRIGATOR) ×3 IMPLANT
SHEARS HARMONIC ACE PLUS 36CM (ENDOMECHANICALS) ×3 IMPLANT
SLEEVE XCEL OPT CAN 5 100 (ENDOMECHANICALS) ×6 IMPLANT
SPONGE LAP 18X18 X RAY DECT (DISPOSABLE) ×3 IMPLANT
STAPLER GUN LINEAR PROX 60 (STAPLE) ×3 IMPLANT
STAPLER PROXIMATE 75MM BLUE (STAPLE) ×3 IMPLANT
STAPLER VISISTAT 35W (STAPLE) IMPLANT
SUT ETHILON 2 0 PS N (SUTURE) IMPLANT
SUT MNCRL AB 4-0 PS2 18 (SUTURE) ×3 IMPLANT
SUT PDS AB 0 CTX 60 (SUTURE) IMPLANT
SUT PDS AB 1 TP1 96 (SUTURE) ×6 IMPLANT
SUT SILK 2 0 (SUTURE) ×2
SUT SILK 2 0 SH CR/8 (SUTURE) ×3 IMPLANT
SUT SILK 2-0 18XBRD TIE 12 (SUTURE) ×1 IMPLANT
SUT SILK 3 0 (SUTURE) ×2
SUT SILK 3 0 SH CR/8 (SUTURE) ×3 IMPLANT
SUT SILK 3-0 18XBRD TIE 12 (SUTURE) ×1 IMPLANT
SUT VIC AB 1 CTX 18 (SUTURE) IMPLANT
SUT VIC AB 3-0 SH 18 (SUTURE) IMPLANT
SYS LAPSCP GELPORT 120MM (MISCELLANEOUS)
SYSTEM LAPSCP GELPORT 120MM (MISCELLANEOUS) IMPLANT
TAPE UMBILICAL COTTON 1/8X30 (MISCELLANEOUS) IMPLANT
TOWEL OR 17X26 10 PK STRL BLUE (TOWEL DISPOSABLE) ×3 IMPLANT
TOWEL OR NON WOVEN STRL DISP B (DISPOSABLE) ×3 IMPLANT
TRAY FOLEY W/METER SILVER 16FR (SET/KITS/TRAYS/PACK) ×3 IMPLANT
TROCAR BLADELESS OPT 5 100 (ENDOMECHANICALS) ×3 IMPLANT
TROCAR XCEL 12X100 BLDLESS (ENDOMECHANICALS) IMPLANT
TROCAR XCEL NON-BLD 11X100MML (ENDOMECHANICALS) IMPLANT
TUBING INSUF HEATED (TUBING) ×3 IMPLANT

## 2016-11-04 NOTE — Anesthesia Preprocedure Evaluation (Addendum)
Anesthesia Evaluation  Patient identified by MRN, date of birth, ID band Patient awake    Reviewed: Allergy & Precautions, NPO status , Patient's Chart, lab work & pertinent test results  History of Anesthesia Complications Negative for: history of anesthetic complications  Airway Mallampati: II  TM Distance: >3 FB Neck ROM: Full    Dental no notable dental hx. (+) Dental Advisory Given   Pulmonary neg pulmonary ROS,    Pulmonary exam normal        Cardiovascular negative cardio ROS Normal cardiovascular exam     Neuro/Psych negative neurological ROS  negative psych ROS   GI/Hepatic negative GI ROS, Neg liver ROS,   Endo/Other  negative endocrine ROS  Renal/GU negative Renal ROS  negative genitourinary   Musculoskeletal negative musculoskeletal ROS (+)   Abdominal   Peds negative pediatric ROS (+)  Hematology negative hematology ROS (+)   Anesthesia Other Findings   Reproductive/Obstetrics negative OB ROS                            Anesthesia Physical Anesthesia Plan  ASA: II  Anesthesia Plan: General   Post-op Pain Management:    Induction: Intravenous  PONV Risk Score and Plan: 3 and Ondansetron, Dexamethasone and Scopolamine patch - Pre-op  Airway Management Planned:   Additional Equipment:   Intra-op Plan:   Post-operative Plan: Extubation in OR  Informed Consent: I have reviewed the patients History and Physical, chart, labs and discussed the procedure including the risks, benefits and alternatives for the proposed anesthesia with the patient or authorized representative who has indicated his/her understanding and acceptance.   Dental advisory given  Plan Discussed with: CRNA, Anesthesiologist and Surgeon  Anesthesia Plan Comments:        Anesthesia Quick Evaluation

## 2016-11-04 NOTE — Anesthesia Postprocedure Evaluation (Signed)
Anesthesia Post Note  Patient: Ray Shelton.  Procedure(s) Performed: Procedure(s) (LRB): LAPAROSCOPIC PARTIAL COLECTOMY (N/A)     Patient location during evaluation: PACU Anesthesia Type: General Level of consciousness: sedated Pain management: pain level controlled Vital Signs Assessment: post-procedure vital signs reviewed and stable Respiratory status: spontaneous breathing and respiratory function stable Cardiovascular status: stable Postop Assessment: no apparent nausea or vomiting Anesthetic complications: no    Last Vitals:  Vitals:   11/04/16 1300 11/04/16 1315  BP: (!) 162/105 (!) 156/102  Pulse: 70 74  Resp: 11 18  Temp:    SpO2: 100% 100%    Last Pain:  Vitals:   11/04/16 1315  TempSrc:   PainSc: 7                  Jatia Musa DANIEL

## 2016-11-04 NOTE — Interval H&P Note (Signed)
History and Physical Interval Note:  11/04/2016 9:22 AM  F[place]on Reek Andree Moro.  has presented today for surgery, with the diagnosis of cecal Polyp  The various methods of treatment have been discussed with the patient and family. After consideration of risks, benefits and other options for treatment, the patient has consented to  Procedure(s): LAPAROSCOPIC PARTIAL COLECTOMY (N/A) as a surgical intervention .  The patient's history has been reviewed, patient examined, no change in status, stable for surgery.  I have reviewed the patient's chart and labs.  Questions were answered to the patient's satisfaction.    Leighton Ruff. Redmond Pulling, MD, [place], Bariatric, & Minimally Invasive Surgery [facility], [place]   [facility]

## 2016-11-04 NOTE — Transfer of Care (Signed)
Immediate Anesthesia Transfer of Care Note  Patient: Ray Shelton.  Procedure(s) Performed: Procedure(s): LAPAROSCOPIC PARTIAL COLECTOMY (N/A)  Patient Location: PACU  Anesthesia Type:General  Level of Consciousness:  sedated, patient cooperative and responds to stimulation  Airway & Oxygen Therapy:Patient Spontanous Breathing and Patient connected to face mask oxgen  Post-op Assessment:  Report given to PACU RN and Post -op Vital signs reviewed and stable  Post vital signs:  Reviewed and stable  Last Vitals:  Vitals:   11/04/16 0736 11/04/16 0806  BP: (!) 169/103 (!) 138/98  Pulse: 86   Resp: 18   Temp: 36.8 C   SpO2: 41%     Complications: No apparent anesthesia complications

## 2016-11-04 NOTE — Op Note (Signed)
NAMECHICK, COUSINS             ACCOUNT NO.:  [id number]  MEDICAL RECORD NO.:  [id number]  LOCATION:                                 FACILITY:  PHYSICIAN:  Leighton Ruff. Redmond Pulling, MD, FACSDATE OF BIRTH:  [birth year]  DATE OF PROCEDURE:  11/04/2016 DATE OF DISCHARGE:                              OPERATIVE REPORT   PREOPERATIVE DIAGNOSIS:  Cecal tubulovillous polyp with high-grade dysplasia.  POSTOPERATIVE DIAGNOSIS:  Cecal tubulovillous polyp with high-grade dysplasia.  PROCEDURE:  Laparoscopic right colectomy.  SURGEON:  Leighton Ruff. Redmond Pulling, MD, FACS.  ASSISTANT SURGEON:  Stark Klein, MD FACS  ANESTHESIA:  General.  EBL:  150 mL.  SPECIMENS: 1. Terminal ileum and right colon. 2. Additional terminal ileum.  INDICATIONS FOR PROCEDURE:  The patient is a 56 year old gentleman who underwent screening colonoscopy.  He was found to have multiple polyps, which were all able to be snared and removed, which were found to be benign tubular adenomas.  He did also have a large cecal sessile polyp that was not amenable to endoscopic resection.  Biopsies did show tubulovillous adenoma with some high-grade dysplasia.  He was referred for consultation.  We had a prolonged discussion regarding his colonoscopy report and I recommended a laparoscopic right colectomy.  We did discuss sampling air with the biopsy.  We discussed at length the risks and benefits of the procedure, which were detailed in his office note.  Preoperatively, the patient did a MiraLax bowel prep with oral antibiotics in addition to ERAS medications.  DESCRIPTION OF PROCEDURE:  The patient was given 5000 units of subcutaneous heparin along with Entereg and oral ERAS medications.  He was then brought to the operating room after obtaining informed consent, placed supine on the operating room table.  General endotracheal anesthesia was established.  Sequential compression devices were placed. A Foley catheter was placed.  His  left arm was tucked at the side with the appropriate padding.  His abdomen was prepped and draped in usual standard surgical fashion with ChloraPrep.  A surgical time-out was performed.  He did receive IV antibiotics prior to skin incision.  I elected to gain access to his abdomen using the Optiview technique.  A small left subcostal incision was made with a knife and then using a 0 degree 5-mm laparoscope through a 5-mm trocar, I advanced it through all layers of the abdominal wall under direct visualization and entered the abdominal cavity.  Pneumoperitoneum was smoothly established up to the patient pressure of 15 mmHg.  There was no evidence of injury to surrounding structures.  A 5-mm trocar was placed in the lower midline and one in the left lower quadrant all under direct visualization.  I then infiltrated Exparel in bilateral upper abdominal walls as a Tap block.  The patient was then placed in Trendelenburg and rotated to the left.  Some of his omentum was stuck into his pelvis.  The right side of the omentum was mobilized with Harmonic scalpel and was able to lift up and expose the right side of the abdomen.  The small bowel was then reflected into the upper abdomen.  The appendix and cecum were visualized along with the terminal ileum.  I scored the peritoneum anchoring the appendix to the pelvic inlet with Harmonic scalpel.  I was then able to get into the avascular plane and take down the white line of Toldt going up the ascending colon, staying anterior to Gerota's fascia.  I identified the duodenum and stayed anterior to this.  This was all done with a combination of blunt dissection with an atraumatic bowel grasper along with Harmonic scalpel.  I felt that I achieved enough mobilization from an inferior aspect.  I then placed the patient in reverse Trendelenburg, identified the transverse colon, the omentum, the gastrocolic ligament was identified.  I then incised it  with Harmonic scalpel ensuring that I was just taking down the gastrocolic ligament.  I got into the avascular plane and continued to march toward the hepatic flexure, taking down and mobilizing it with the Harmonic scalpel.  I was able to come around the hepatic flexure and take down its lateral attachments.  The duodenum was visualized.  I stayed anterior to the duodenum.  This was all done with Harmonic scalpel along with atraumatic bowel graspers.  I was then able to continue to medialize the distal ascending colon and taking down some of the avascular attachments with a combination of blunt dissection along with Harmonic scalpel.  I then went back inferiorly and after placing the patient in Trendelenburg, I was unable to lift up the right colon anteriorly toward the abdominal wall and get behind it and identified the duodenum.  There was still a few avascular attachments, which were taken down with Harmonic scalpel.  At this point, the right colon was well mobilized and medialized.  A small upper midline incision was made just above the umbilicus up toward the xiphoid with a 10-blade. Subcutaneous tissue was divided with electrocautery.  The fascia was incised and pneumoperitoneum was released.  A wound protector was placed.  I then brought out the cecum.  I ended up mobilizing some more of the gastrocolic ligament off the transverse colon with the LigaSure device.  I then identified the section of terminal ileum that I planned to divide.  It was at least 5 cm from the ileocecal valve.  There was already a small rent in the mesentery.  I then divided the bowel with a GIA 75 stapler with a blue load.  I then identified a section of the proximal transverse colon around the right branch of the middle colic vessel.  A small window was made in the mesentery just next to the transverse colon.  It was then divided with a GIA 75 stapler with a blue load.  I then started taking down the  mesentery in sequential fashion with LigaSure device.  The specimen was ultimately freed.  Two figure-of- eight silk sutures were placed in the transverse colon mesentery for hemostasis.  These were not deep sutures.  It was just oozing from the edge of transection after the LigaSure device.  Then, there was still omentum that was stuck to the left lower quadrant, which was obscuring some of my view, so I ended up mobilizing and amputating the omentum in order to retract it out of the away.  At this point, I was able to bring the end of the terminal ileum and inspected.  I decided to take another 2 inches of the terminal ileum because the mesentery was slightly foreshortened in this location.  It was transected with a GIA 75 stapler with a blue load and passed off the field.  This  left a more clean edge of the terminal ileum and mesentery.  I then brought the terminal ileum and proximal transverse colon in a side-to-side fashion to create a functional end-to-end anastomosis.  The corner of each staple line was excised with electrocautery.  I then was able to place a [place] through each transected corner of the staple line.  One limb of a GIA 45 stapler was placed through the small bowel and one through the transverse colon. The staple was brought together and fired to create a common channel. The anastomosis was widely patent and hemostatic.  I then closed the common defect with a single fire of a TA 60 stapler with a blue load.  I then placed a 3-0 silk suture in the crotch of the anastomosis.  Again, the anastomosis was widely patent and well vascularized.  There was no evidence of ischemia.  The mesenteric defect was quite large, so I did not close it.  The bowel was returned to the abdomen and I irrigated the abdomen with 2 liters of saline.  We then placed antibiotic irrigation in the abdomen and then changed out dirty instruments and drapes, and changed gown and gloves and brought in  a new instrument tray.  I then evacuated the antibiotic irrigation from the abdominal cavity.  I then closed the small upper midline incision with a running #1 looped PDS 1 from above and 1 from below and tied centrally.  I then re-established pneumoperitoneum to inspect the fascial closure.  Nothing was trapped within it.  Remaining irrigation fluid was evacuated from the abdominal cavity.  I then infiltrated the remaining Exparel around the upper midline incision in the preperitoneal space.  Pneumoperitoneum was released.  The three trocars were removed and all skin incisions were closed with 4-0 Monocryl in a subcuticular fashion followed by the application of benzoin and Steri-Strips and a sterile dressing.  All needle, instrument and sponge counts were correct x2.  There were no immediate complications.  The patient tolerated the procedure well.  He was extubated and taken to the recovery room in stable condition.     Leighton Ruff. Redmond Pulling, MD, FACS     EMW/MEDQ  D:  11/04/2016  T:  11/04/2016  Job:  [id number]  cc:   Gatha Mayer, MD,FACG [facility] [address] [place], [place] [address]

## 2016-11-04 NOTE — Brief Op Note (Signed)
11/04/2016  12:21 PM  PATIENT:  Ray Shelton.  56 y.o. male  PRE-OPERATIVE DIAGNOSIS:  cecal tubulovillous Polyp with high grade dysplasia  POST-OPERATIVE DIAGNOSIS:  same  PROCEDURE:  Procedure(s): LAPAROSCOPIC RIGHT COLECTOMY (N/A)  SURGEON:  Surgeon(s) and Role:    Greer Pickerel, MD - Primary  PHYSICIAN ASSISTANT:   ASSISTANTS: Stark Klein MD FACS   ANESTHESIA:   general  EBL:  Total I/O In: 2000 [I.V.:2000] Out: 300 [Urine:150; Blood:150]  BLOOD ADMINISTERED:none  DRAINS: Urinary Catheter (Foley)   LOCAL MEDICATIONS USED:  OTHER exparel  SPECIMEN:  Source of Specimen:  TI with rt colon; additional TI  DISPOSITION OF SPECIMEN:  PATHOLOGY  COUNTS:  YES  TOURNIQUET:  * No tourniquets in log *  DICTATION: .Other Dictation: Dictation Number [id number]  PLAN OF CARE: Admit to inpatient   PATIENT DISPOSITION:  PACU - hemodynamically stable.   Delay start of Pharmacological VTE agent (>24hrs) due to surgical blood loss or risk of bleeding: no  Leighton Ruff. Redmond Pulling, MD, FACS General, Bariatric, & Minimally Invasive Surgery [facility], [place]

## 2016-11-04 NOTE — Anesthesia Procedure Notes (Signed)
Procedure Name: Intubation Date/Time: 11/04/2016 9:49 AM Performed by: Talbot Grumbling Pre-anesthesia Checklist: Patient identified, Emergency Drugs available and Patient being monitored Patient Re-evaluated:Patient Re-evaluated prior to induction Oxygen Delivery Method: Circle system utilized Preoxygenation: Pre-oxygenation with 100% oxygen Induction Type: IV induction Ventilation: Mask ventilation without difficulty and Oral airway inserted - appropriate to patient size Laryngoscope Size: Mac and 3 Grade View: Grade II Tube type: Oral Tube size: 8.0 mm Number of attempts: 1 Airway Equipment and Method: Stylet

## 2016-11-05 ENCOUNTER — Encounter (HOSPITAL_COMMUNITY): Payer: Self-pay | Admitting: General Surgery

## 2016-11-05 LAB — CBC
HCT: 43.7 % (ref 39.0–52.0)
HEMOGLOBIN: 14.7 g/dL (ref 13.0–17.0)
MCH: 29.7 pg (ref 26.0–34.0)
MCHC: 33.6 g/dL (ref 30.0–36.0)
MCV: 88.3 fL (ref 78.0–100.0)
PLATELETS: 218 10*3/uL (ref 150–400)
RBC: 4.95 MIL/uL (ref 4.22–5.81)
RDW: 13.5 % (ref 11.5–15.5)
WBC: 15.9 10*3/uL — AB (ref 4.0–10.5)

## 2016-11-05 LAB — BASIC METABOLIC PANEL
Anion gap: 9 (ref 5–15)
BUN: 13 mg/dL (ref 6–20)
CALCIUM: 8.7 mg/dL — AB (ref 8.9–10.3)
CHLORIDE: 101 mmol/L (ref 101–111)
CO2: 26 mmol/L (ref 22–32)
CREATININE: 0.98 mg/dL (ref 0.61–1.24)
GFR calc non Af Amer: >60 mL/min (ref >60)
Glucose, Bld: 115 mg/dL — ABNORMAL HIGH (ref 65–99)
Potassium: 4.5 mmol/L (ref 3.5–5.1)
SODIUM: 136 mmol/L (ref 135–145)

## 2016-11-05 NOTE — Progress Notes (Signed)
Patient requesting for diet to be advanced to full liquids, per discussion he had with MD.  On-call Dr. Windle Guard notified of patient's request, and informed that patient had moved bowels, and having gas.  Verbal order received for diet to be advanced to full liquids.

## 2016-11-05 NOTE — Progress Notes (Signed)
Patient had small bowel movement, with flatus after.

## 2016-11-05 NOTE — Care Management Note (Signed)
Case Management Note  Patient Details  Name: Sarthak Rubenstein. MRN: [id number] Date of Birth: [birth year]  Subjective/Objective:                  Right colectomy  Action/Plan: Date:  November 05, 2016 Chart reviewed for concurrent status and case management needs.  Will continue to follow patient progress.  Discharge Planning: following for needs  Expected discharge date: [id number]  Velva Harman, BSN, [place], [place]   Expected Discharge Date:                  Expected Discharge Plan:  Home/Self Care  In-House Referral:     Discharge planning Services  CM Consult  Post Acute Care Choice:    Choice offered to:     DME Arranged:    DME Agency:     HH Arranged:    HH Agency:     Status of Service:  In process, will continue to follow  If discussed at Long Length of Stay Meetings, dates discussed:    Additional Comments:  Leeroy Cha, RN 11/05/2016, 7:58 AM

## 2016-11-05 NOTE — Progress Notes (Signed)
1 Day Post-Op   Subjective/Chief Complaint: Doing well. No n/v. Tolerated water/ice. Ambulated soon. Reports he had urine in his foley bag. Min pain   Objective: Vital signs in last 24 hours: Temp:  [97.5 F (36.4 C)-98.9 F (37.2 C)] 97.8 F (36.6 C) (09/26 9509) Pulse Rate:  [71-84] 79 (09/26 0638) Resp:  [15-17] 16 (09/26 3267) BP: (124-158)/(81-89) 158/85 (09/26 1245) SpO2:  [93 %-96 %] 96 % (09/26 8099)    Intake/Output from previous day: 09/25 0701 - 09/26 0700 In: 5182.3 [P.O.:120; I.V.:3462.3] Out: 2025 [Urine:1875; Blood:150] Intake/Output this shift: No intake/output data recorded.  Alert, resting comfortably. Nontoxic cta Reg Soft, mild approp TTP, incisions c/d/i.  No edema  Lab Results:   Recent Labs  11/03/16 0831 11/05/16 0503  WBC 8.9 15.9*  HGB 16.7 14.7  HCT 49.6 43.7  PLT 263 218   BMET  Recent Labs  11/04/16 0750 11/05/16 0503  NA 137 136  K 4.5 4.5  CL 99* 101  CO2 26 26  GLUCOSE 105* 115*  BUN 13 13  CREATININE 1.14 0.98  CALCIUM 9.6 8.7*   PT/INR No results for input(s): LABPROT, INR in the last 72 hours. ABG No results for input(s): PHART, HCO3 in the last 72 hours.  Invalid input(s): PCO2, PO2  Studies/Results: No results found.  Anti-infectives: Anti-infectives    Start     Dose/Rate Route Frequency Ordered Stop   11/04/16 1151  polymyxin B 500,000 Units, bacitracin 50,000 Units in sodium chloride 0.9 % 500 mL irrigation  Status:  Discontinued       As needed 11/04/16 1151 11/04/16 1228   11/04/16 0735  cefoTEtan in Dextrose 5% (CEFOTAN) IVPB 2 g     2 g Intravenous On call to O.R. 11/04/16 0735 11/04/16 1005      Assessment/Plan: s/p Procedure(s): LAPAROSCOPIC PARTIAL COLECTOMY (N/A) Doing well Clear liquids Cont entereg Cont chemical vte prophylaxis Ambulate  Leighton Ruff. Redmond Pulling, MD, FACS General, Bariatric, & Minimally Invasive Surgery [facility], [place]   LOS: 1 day    [name] 11/05/2016

## 2016-11-06 MED ORDER — PANTOPRAZOLE SODIUM 40 MG PO TBEC
40.0000 mg | DELAYED_RELEASE_TABLET | Freq: Every day | ORAL | Status: DC
  Administered 2016-11-06: 40 mg via ORAL
  Filled 2016-11-06: qty 1

## 2016-11-06 NOTE — Discharge Instructions (Signed)
SURGERY: POST OP INSTRUCTIONS °(Surgery for small bowel obstruction, colon resection, etc) ° ° °###################################################################### ° °EAT °Gradually transition to a high fiber diet with a fiber supplement over the next few days after discharge ° °WALK °Walk an hour a day.  Control your pain to do that.   ° °CONTROL PAIN °Control pain so that you can walk, sleep, tolerate sneezing/coughing, go up/down stairs. ° °HAVE A BOWEL MOVEMENT DAILY °Keep your bowels regular to avoid problems.  OK to try a laxative to override constipation.  OK to use an antidairrheal to slow down diarrhea.  Call if not better after 2 tries ° °CALL IF YOU HAVE PROBLEMS/CONCERNS °Call if you are still struggling despite following these instructions. °Call if you have concerns not answered by these instructions ° °###################################################################### ° ° °DIET °Follow a light diet the first few days at home.  Start with a bland diet such as soups, liquids, starchy foods, low fat foods, etc.  If you feel full, bloated, or constipated, stay on a ful liquid or pureed/blenderized diet for a few days until you feel better and no longer constipated. °Be sure to drink plenty of fluids every day to avoid getting dehydrated (feeling dizzy, not urinating, etc.). °Gradually add a fiber supplement to your diet over the next week.  Gradually get back to a regular solid diet.  Avoid fast food or heavy meals the first week as you are more likely to get nauseated. °It is expected for your digestive tract to need a few months to get back to normal.  It is common for your bowel movements and stools to be irregular.  You will have occasional bloating and cramping that should eventually fade away.  Until you are eating solid food normally, off all pain medications, and back to regular activities; your bowels will not be normal. °Focus on eating a low-fat, high fiber diet the rest of your life  (See Getting to Good Bowel Health, below). ° °CARE of your INCISION or WOUND °It is good for closed incision and even open wounds to be washed every day.  Shower every day.  Short baths are fine.  Wash the incisions and wounds clean with soap & water.    °If you have a closed incision(s), wash the incision with soap & water every day.  You may leave closed incisions open to air if it is dry.   You may cover the incision with clean gauze & replace it after your daily shower for comfort. °If you have skin tapes (Steristrips) or skin glue (Dermabond) on your incision, leave them in place.  They will fall off on their own like a scab.  You may trim any edges that curl up with clean scissors.  If you have staples, set up an appointment for them to be removed in the office in 10 days after surgery.  °If you have a drain, wash around the skin exit site with soap & water and place a new dressing of gauze or band aid around the skin every day.  Keep the drain site clean & dry.    °If you have an open wound with packing, see wound care instructions.  In general, it is encouraged that you remove your dressing and packing, shower with soap & water, and replace your dressing once a day.  Pack the wound with clean gauze moistened with normal (0.9%) saline to keep the wound moist & uninfected.  Pressure on the dressing for 30 minutes will stop most wound   bleeding.  Eventually your body will heal & pull the open wound closed over the next few months.  °Raw open wounds will occasionally bleed or secrete yellow drainage until it heals closed.  Drain sites will drain a little until the drain is removed.  Even closed incisions can have mild bleeding or drainage the first few days until the skin edges scab over & seal.   °If you have an open wound with a wound vac, see wound vac care instructions. ° ° ° ° °ACTIVITIES as tolerated °Start light daily activities --- self-care, walking, climbing stairs-- beginning the day after surgery.   Gradually increase activities as tolerated.  Control your pain to be active.  Stop when you are tired.  Ideally, walk several times a day, eventually an hour a day.   °Most people are back to most day-to-day activities in a few weeks.  It takes 4-8 weeks to get back to unrestricted, intense activity. °If you can walk 30 minutes without difficulty, it is safe to try more intense activity such as jogging, treadmill, bicycling, low-impact aerobics, swimming, etc. °Save the most intensive and strenuous activity for last (Usually 4-8 weeks after surgery) such as sit-ups, heavy lifting, contact sports, etc.  Refrain from any intense heavy lifting or straining until you are off narcotics for pain control.  You will have off days, but things should improve week-by-week. °DO NOT PUSH THROUGH PAIN.  Let pain be your guide: If it hurts to do something, don't do it.  Pain is your body warning you to avoid that activity for another week until the pain goes down. °You may drive when you are no longer taking narcotic prescription pain medication, you can comfortably wear a seatbelt, and you can safely make sudden turns/stops to protect yourself without hesitating due to pain. °You may have sexual intercourse when it is comfortable. If it hurts to do something, stop. ° °MEDICATIONS °Take your usually prescribed home medications unless otherwise directed.   °Blood thinners:  °Usually you can restart any strong blood thinners after the second postoperative day.  It is OK to take aspirin right away.    ° If you are on strong blood thinners (warfarin/Coumadin, Plavix, Xerelto, Eliquis, Pradaxa, etc), discuss with your surgeon, medicine PCP, and/or cardiologist for instructions on when to restart the blood thinner & if blood monitoring is needed (PT/INR blood check, etc).   ° ° °PAIN CONTROL °Pain after surgery or related to activity is often due to strain/injury to muscle, tendon, nerves and/or incisions.  This pain is usually  short-term and will improve in a few months.  °To help speed the process of healing and to get back to regular activity more quickly, DO THE FOLLOWING THINGS TOGETHER: °1. Increase activity gradually.  DO NOT PUSH THROUGH PAIN °2. Use Ice and/or Heat °3. Try Gentle Massage and/or Stretching °4. Take over the counter pain medication °5. Take Narcotic prescription pain medication for more severe pain ° °Good pain control = faster recovery.  It is better to take more medicine to be more active than to stay in bed all day to avoid medications. °1.  Increase activity gradually °Avoid heavy lifting at first, then increase to lifting as tolerated over the next 6 weeks. °Do not “push through” the pain.  Listen to your body and avoid positions and maneuvers than reproduce the pain.  Wait a few days before trying something more intense °Walking an hour a day is encouraged to help your body recover faster   and more safely.  Start slowly and stop when getting sore.  If you can walk 30 minutes without stopping or pain, you can try more intense activity (running, jogging, aerobics, cycling, swimming, treadmill, sex, sports, weightlifting, etc.) °Remember: If it hurts to do it, then don’t do it! °2. Use Ice and/or Heat °You will have swelling and bruising around the incisions.  This will take several weeks to resolve. °Ice packs or heating pads (6-8 times a day, 30-60 minutes at a time) will help sooth soreness & bruising. °Some people prefer to use ice alone, heat alone, or alternate between ice & heat.  Experiment and see what works best for you.  Consider trying ice for the first few days to help decrease swelling and bruising; then, switch to heat to help relax sore spots and speed recovery. °Shower every day.  Short baths are fine.  It feels good!  Keep the incisions and wounds clean with soap & water.   °3. Try Gentle Massage and/or Stretching °Massage at the area of pain many times a day °Stop if you feel pain - do not  overdo it °4. Take over the counter pain medication °This helps the muscle and nerve tissues become less irritable and calm down faster °Choose ONE of the following over-the-counter anti-inflammatory medications: °Acetaminophen 500mg tabs (Tylenol) 1-2 pills with every meal and just before bedtime (avoid if you have liver problems or if you have acetaminophen in you narcotic prescription) °Naproxen 220mg tabs (ex. Aleve, Naprosyn) 1-2 pills twice a day (avoid if you have kidney, stomach, IBD, or bleeding problems) °Ibuprofen 200mg tabs (ex. Advil, Motrin) 3-4 pills with every meal and just before bedtime (avoid if you have kidney, stomach, IBD, or bleeding problems) °Take with food/snack several times a day as directed for at least 2 weeks to help keep pain / soreness down & more manageable. °5. Take Narcotic prescription pain medication for more severe pain °A prescription for strong pain control is often given to you upon discharge (for example: oxycodone/Percocet, hydrocodone/Norco/Vicodin, or tramadol/Ultram) °Take your pain medication as prescribed. °Be mindful that most narcotic prescriptions contain Tylenol (acetaminophen) as well - avoid taking too much Tylenol. °If you are having problems/concerns with the prescription medicine (does not control pain, nausea, vomiting, rash, itching, etc.), please call us [phone] to see if we need to switch you to a different pain medicine that will work better for you and/or control your side effects better. °If you need a refill on your pain medication, you must call the office before 4 pm and on weekdays only.  By federal law, prescriptions for narcotics cannot be called into a pharmacy.  They must be filled out on paper & picked up from our office by the patient or authorized caretaker.  Prescriptions cannot be filled after 4 pm nor on weekends.   ° °WHEN TO CALL US [phone] °Severe uncontrolled or worsening pain  °Fever over 101 F (38.5 C) °Concerns with  the incision: Worsening pain, redness, rash/hives, swelling, bleeding, or drainage °Reactions / problems with new medications (itching, rash, hives, nausea, etc.) °Nausea and/or vomiting °Difficulty urinating °Difficulty breathing °Worsening fatigue, dizziness, lightheadedness, blurred vision °Other concerns °If you are not getting better after two weeks or are noticing you are getting worse, contact our office [phone] for further advice.  We may need to adjust your medications, re-evaluate you in the office, send you to the emergency room, or see what other things we can do to help. °The   clinic staff is available to answer your questions during regular business hours (8:30am-5pm).  Please don’t hesitate to call and ask to speak to one of our nurses for clinical concerns.    °A surgeon from [facility] is always on call at the hospitals 24 hours/day °If you have a medical emergency, go to the nearest emergency room or call 911. ° °FOLLOW UP in our office °One the day of your discharge from the hospital (or the next business weekday), please call [facility] to set up or confirm an appointment to see your surgeon in the office for a follow-up appointment.  Usually it is 2-3 weeks after your surgery.   °If you have skin staples at your incision(s), let the office know so we can set up a time in the office for the nurse to remove them (usually around 10 days after surgery). °Make sure that you call for appointments the day of discharge (or the next business weekday) from the hospital to ensure a convenient appointment time. °IF YOU HAVE DISABILITY OR FAMILY LEAVE FORMS, BRING THEM TO THE OFFICE FOR PROCESSING.  DO NOT GIVE THEM TO YOUR DOCTOR. ° °[facility] °[address], [address], [place], [place]  [address] ? °[phone] - Main °[phone] - Toll Free,  [phone] - Fax °[email or web address] ° °GETTING TO GOOD BOWEL HEALTH. °It is  expected for your digestive tract to need a few months to get back to normal.  It is common for your bowel movements and stools to be irregular.  You will have occasional bloating and cramping that should eventually fade away.  Until you are eating solid food normally, off all pain medications, and back to regular activities; your bowels will not be normal.   °Avoiding constipation °The goal: ONE SOFT BOWEL MOVEMENT A DAY!    °Drink plenty of fluids.  Choose water first. °TAKE A FIBER SUPPLEMENT EVERY DAY THE REST OF YOUR LIFE °During your first week back home, gradually add back a fiber supplement every day °Experiment which form you can tolerate.   There are many forms such as powders, tablets, wafers, gummies, etc °Psyllium bran (Metamucil), methylcellulose (Citrucel), Miralax or Glycolax, Benefiber, Flax Seed.  °Adjust the dose week-by-week (1/2 dose/day to 6 doses a day) until you are moving your bowels 1-2 times a day.  Cut back the dose or try a different fiber product if it is giving you problems such as diarrhea or bloating. °Sometimes a laxative is needed to help jump-start bowels if constipated until the fiber supplement can help regulate your bowels.  If you are tolerating eating & you are farting, it is okay to try a gentle laxative such as double dose MiraLax, prune juice, or Milk of Magnesia.  Avoid using laxatives too often. °Stool softeners can sometimes help counteract the constipating effects of narcotic pain medicines.  It can also cause diarrhea, so avoid using for too long. °If you are still constipated despite taking fiber daily, eating solids, and a few doses of laxatives, call our office. °Controlling diarrhea °Try drinking liquids and eating bland foods for a few days to avoid stressing your intestines further. °Avoid dairy products (especially milk & ice cream) for a short time.  The intestines often can lose the ability to digest lactose when stressed. °Avoid foods that cause gassiness or  bloating.  Typical foods include beans and other legumes, cabbage, broccoli, and dairy foods.  Avoid greasy, spicy, fast foods.  Every person has   some sensitivity to other foods, so listen to your body and avoid those foods that trigger problems for you. °Probiotics (such as active yogurt, Align, etc) may help repopulate the intestines and colon with normal bacteria and calm down a sensitive digestive tract °Adding a fiber supplement gradually can help thicken stools by absorbing excess fluid and retrain the intestines to act more normally.  Slowly increase the dose over a few weeks.  Too much fiber too soon can backfire and cause cramping & bloating. °It is okay to try and slow down diarrhea with a few doses of antidiarrheal medicines.   °Bismuth subsalicylate (ex. Kayopectate, Pepto Bismol) for a few doses can help control diarrhea.  Avoid if pregnant.   °Loperamide (Imodium) can slow down diarrhea.  Start with one tablet (2mg) first.  Avoid if you are having fevers or severe pain.  °ILEOSTOMY PATIENTS WILL HAVE CHRONIC DIARRHEA since their colon is not in use.    °Drink plenty of liquids.  You will need to drink even more glasses of water/liquid a day to avoid getting dehydrated. °Record output from your ileostomy.  Expect to empty the bag every 3-4 hours at first.  Most people with a permanent ileostomy empty their bag 4-6 times at the least.   °Use antidiarrheal medicine (especially Imodium) several times a day to avoid getting dehydrated.  Start with a dose at bedtime & breakfast.  Adjust up or down as needed.  Increase antidiarrheal medications as directed to avoid emptying the bag more than 8 times a day (every 3 hours). °Work with your wound ostomy nurse to learn care for your ostomy.  See ostomy care instructions. °TROUBLESHOOTING IRREGULAR BOWELS °1) Start with a soft & bland diet. No spicy, greasy, or fried foods.  °2) Avoid gluten/wheat or dairy products from diet to see if symptoms improve. °3) Miralax  17gm or flax seed mixed in 8oz. water or juice-daily. May use 2-4 times a day as needed. °4) Gas-X, Phazyme, etc. as needed for gas & bloating.  °5) Prilosec (omeprazole) over-the-counter as needed °6)  Consider probiotics (Align, Activa, etc) to help calm the bowels down ° °Call your doctor if you are getting worse or not getting better.  Sometimes further testing (cultures, endoscopy, X-ray studies, CT scans, bloodwork, etc.) may be needed to help diagnose and treat the cause of the diarrhea. °[facility] °[address], [address], [place], [place]  [address] °[phone] - Main.    °[phone]  - Toll Free.   [phone] - Fax °[email or web address] ° ° °

## 2016-11-06 NOTE — Progress Notes (Signed)
2 Days Post-Op   Subjective/Chief Complaint: Reports he had a loose BM overnight. Min pain just 'soreness'  Ambulated multiple times Tolerated full liquids last night No n/v   Objective: Vital signs in last 24 hours: Temp:  [98 F (36.7 C)-98.5 F (36.9 C)] 98.5 F (36.9 C) (09/27 0635) Pulse Rate:  [62-68] 63 (09/27 0635) Resp:  [16-18] 16 (09/27 0635) BP: (137-143)/(84-91) 137/91 (09/27 0635) SpO2:  [96 %-98 %] 96 % (09/27 0635)    Intake/Output from previous day: 09/26 0701 - 09/27 0700 In: 3360 [P.O.:960; I.V.:2400] Out: -  Intake/Output this shift: No intake/output data recorded.  Resting comfortably, nad, nontoxic symm chest rise, non labored Reg Soft, min TTP, not really distended, incisions c/d/i No edema  Lab Results:   Recent Labs  11/05/16 0503  WBC 15.9*  HGB 14.7  HCT 43.7  PLT 218   BMET  Recent Labs  11/04/16 0750 11/05/16 0503  NA 137 136  K 4.5 4.5  CL 99* 101  CO2 26 26  GLUCOSE 105* 115*  BUN 13 13  CREATININE 1.14 0.98  CALCIUM 9.6 8.7*   PT/INR No results for input(s): LABPROT, INR in the last 72 hours. ABG No results for input(s): PHART, HCO3 in the last 72 hours.  Invalid input(s): PCO2, PO2  Studies/Results: No results found.  Anti-infectives: Anti-infectives    Start     Dose/Rate Route Frequency Ordered Stop   11/04/16 1151  polymyxin B 500,000 Units, bacitracin 50,000 Units in sodium chloride 0.9 % 500 mL irrigation  Status:  Discontinued       As needed 11/04/16 1151 11/04/16 1228   11/04/16 0735  cefoTEtan in Dextrose 5% (CEFOTAN) IVPB 2 g     2 g Intravenous On call to O.R. 11/04/16 0735 11/04/16 1005      Assessment/Plan: s/p Procedure(s): LAPAROSCOPIC PARTIAL COLECTOMY (N/A)  If does well with fulls for breakfast/lunch will adv to soft diet tonight Cont chemical vte prophylaxis HLIV Ambulate prob dc on Friday  Ray Shelton Pulling, MD, FACS General, Bariatric, & Minimally Invasive Surgery [facility], [place]   LOS: 2 days    Ray Shelton 11/06/2016

## 2016-11-06 NOTE — Progress Notes (Signed)
The patient is receiving Protonix by the intravenous route.  Based on criteria approved by the Pharmacy and [facility], the medication is being converted to the equivalent oral dose form.  These criteria include: -No active GI bleeding -Able to tolerate diet of full liquids (or better) or tube feeding -Able to tolerate other medications by the oral or enteral route  If you have any questions about this conversion, please contact the Pharmacy Department (phone [phone]).  Thank you. Eudelia Bunch, Pharm.D. [phone] 11/06/2016 12:43 PM

## 2016-11-07 LAB — CBC
HEMATOCRIT: 42.9 % (ref 39.0–52.0)
Hemoglobin: 14.6 g/dL (ref 13.0–17.0)
MCH: 30.4 pg (ref 26.0–34.0)
MCHC: 34 g/dL (ref 30.0–36.0)
MCV: 89.4 fL (ref 78.0–100.0)
Platelets: 222 10*3/uL (ref 150–400)
RBC: 4.8 MIL/uL (ref 4.22–5.81)
RDW: 13.3 % (ref 11.5–15.5)
WBC: 10.4 10*3/uL (ref 4.0–10.5)

## 2016-11-07 MED ORDER — OXYCODONE HCL 5 MG PO TABS: 5.0000 mg | ORAL_TABLET | Freq: Four times a day (QID) | ORAL | 0 refills | Status: DC | PRN

## 2016-11-07 MED ORDER — ACETAMINOPHEN 500 MG PO TABS: 1000.0000 mg | ORAL_TABLET | Freq: Four times a day (QID) | ORAL | 2 refills | Status: AC | PRN

## 2016-11-07 NOTE — Discharge Summary (Signed)
Physician Discharge Summary  [place] Co. POE:[id number] DOB: [birth year] DOA: 11/04/2016  PCP: Ephriam Jenkins E  Admit date: 11/04/2016 Discharge date: 11/07/2016  Recommendations for Outpatient Follow-up:   Follow-up Information    Greer Pickerel, MD. Go on 11/26/2016.   Specialty:  General Surgery Why:  9 AM (pls arrive at 8:45 AM) Contact information: [place] [address] [phone]          Discharge Diagnoses:  1. 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 He is referred by Dr Carlean Purl for evaluation of a cecal polyp. He states that he was undergoing routine screening colonoscopy around the end of August and he was found to have multiple polyps. All of which were able to be removed except for one in the beginning portion of his colon. All of the polyps were tubular adenomas with the exception of a cecal polyp mass. He was found to have a sessile polyp. 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 He is accompanied by his wife. He denies any abdominal pain. He denies any nausea, vomiting, diarrhea or constipation. He denies any melena or hematochezia. He denies any him plan weight loss. He denies any family history of cancers. He denies any chest pain, chest pressure, shortness of breath, orthopnea, paroxysmal nocturnal dyspnea, TIAs or amaurosis fugax. He denies smoking. He does drink about 2 beers per day.  Hospital Course:  The pt was admitted for planned procedure. He received bowel prep preop along with ERAS medications (entereg,  tylenol, gabapentin) and subcu heparin. He was started on ice chips/water evening of surgery. On POD 1 he was given clear liquids and started on chemical vte prophylaxis. He was continued on scheduled tylenol and neurontin and entereg. He was advanced to Full liquids on evening of POD 1 and later a BM. On POD 2 his diet was advanced to soft.  On POD 3 he was doing well, tolerating a soft diet, had had several BMs, stable vitals and was deemed safe for dc. We discussed dc instructions in detail.   BP 138/83 (BP Location: Right Arm)   Pulse 69   Temp 97.9 F (36.6 C) (Oral)   Resp 18   Ht 5\' 7"  (1.702 m)   Wt 83.9 kg (185 lb)   SpO2 98%   BMI 28.98 kg/m   Gen: alert, NAD, non-toxic appearing Pupils: equal, no scleral icterus Pulm: Lungs clear to auscultation, symmetric chest rise CV: regular rate and rhythm Abd: soft, min tender, nondistended.  No cellulitis. No incisional hernia Ext: no edema, no calf tenderness Skin: no rash, no jaundice    Discharge Instructions  Discharge Instructions    Call MD for:    Complete by:  As directed    Temperature >101   Call MD for:  hives    Complete by:  As directed    Call MD for:  persistant dizziness or light-headedness    Complete by:  As directed    Call MD for:  persistant nausea and vomiting    Complete by:  As directed    Call MD for:  redness, tenderness, or signs of infection (pain, swelling, redness, odor or green/yellow discharge around incision site)    Complete by:  As directed    Call MD for:  severe uncontrolled pain    Complete by:  As directed    Call MD for:  temperature >100.4    Complete by:  As directed    Diet - low sodium heart healthy    Complete by:  As directed    Discharge instructions    Complete by:  As directed    See [facility] discharge instructions   Increase activity slowly    Complete by:  As directed      Allergies as of 11/07/2016   No Known Allergies     Medication List    TAKE these medications    acetaminophen 500 MG tablet Commonly known as:  TYLENOL Take 2 tablets (1,000 mg total) by mouth every 6 (six) hours as needed.   lansoprazole 15 MG capsule Commonly known as:  PREVACID Take 15 mg by mouth daily before breakfast.   oxyCODONE 5 MG immediate release tablet Commonly known as:  Oxy IR/ROXICODONE Take 1 tablet (5 mg total) by mouth every 6 (six) hours as needed for moderate pain.            Discharge Care Instructions        Start     Ordered   11/07/16 0000  oxyCODONE (OXY IR/ROXICODONE) 5 MG immediate release tablet  Every 6 hours PRN     11/07/16 0812   11/07/16 0000  acetaminophen (TYLENOL) 500 MG tablet  Every 6 hours PRN     11/07/16 0812   11/07/16 0000  Discharge instructions    Comments:  See [facility] discharge instructions   11/07/16 0812   11/07/16 0000  Diet - low sodium heart healthy     11/07/16 0812   11/07/16 0000  Increase activity slowly     11/07/16 8527   11/07/16 0000  Call MD for:    Comments:  Temperature >101   11/07/16 0812   11/07/16 0000  Call MD for:  persistant nausea and vomiting     11/07/16 0812   11/07/16 0000  Call MD for:  severe uncontrolled pain     11/07/16 0812   11/07/16 0000  Call MD for:  redness, tenderness, or signs of infection (pain, swelling, redness, odor or green/yellow discharge around incision site)     11/07/16 0812   11/07/16 0000  Call MD for:  hives     11/07/16 0812   11/07/16 0000  Call MD for:  persistant dizziness or light-headedness     11/07/16 0812   11/07/16 0000  Call MD for:  temperature >100.4     11/07/16 7824     Follow-up Information    Greer Pickerel, MD. Go on 11/26/2016.   Specialty:  General Surgery Why:  9 AM (pls arrive at 8:45 AM) Contact information: [address] [place] [address] [phone]            The results of significant diagnostics from this hospitalization (including imaging, microbiology, ancillary and laboratory) are listed below for  reference.    Significant Diagnostic Studies: No results found.  Microbiology: No results found for this or any previous visit (from the past 240 hour(s)).   Labs: Basic Metabolic Panel:  Recent Labs Lab 11/04/16 0750 11/05/16 0503  NA 137 136  K 4.5 4.5  CL 99* 101  CO2 26  26  GLUCOSE 105* 115*  BUN 13 13  CREATININE 1.14 0.98  CALCIUM 9.6 8.7*   Liver Function Tests:  Recent Labs Lab 11/04/16 0750  AST 44*  ALT 49  ALKPHOS 41  BILITOT 1.3*  PROT 8.2*  ALBUMIN 4.7   No results for input(s): LIPASE, AMYLASE in the last 168 hours. No results for input(s): AMMONIA in the last 168 hours. CBC:  Recent Labs Lab 11/03/16 0831 11/05/16 0503 11/07/16 0558  WBC 8.9 15.9* 10.4  NEUTROABS 5.1  --   --   HGB 16.7 14.7 14.6  HCT 49.6 43.7 42.9  MCV 90.0 88.3 89.4  PLT 263 218 222   Cardiac Enzymes: No results for input(s): CKTOTAL, CKMB, CKMBINDEX, TROPONINI in the last 168 hours. BNP: BNP (last 3 results) No results for input(s): BNP in the last 8760 hours.  ProBNP (last 3 results) No results for input(s): PROBNP in the last 8760 hours.  CBG: No results for input(s): GLUCAP in the last 168 hours.  Active Problems:   Colonic mass   Time coordinating discharge: 15 min  Signed:  [name], MD [facility], [place] [phone] 11/07/2016, 8:12 AM

## 2016-11-07 NOTE — Progress Notes (Signed)
Date: November 07, 2016 Discharge orders review for case management needs.  None found  Per patient or family member no additional needs at home. Velva Harman, BSN, [place], CCM:  [phone]

## 2017-01-22 ENCOUNTER — Telehealth: Payer: Self-pay | Admitting: Internal Medicine

## 2017-01-22 NOTE — Telephone Encounter (Signed)
Patient's wife notified that patient is due for a repeat colonoscopy in 09/2017.  She is notified to call in June or July and that we will send a letter prior to August.

## 2017-06-30 ENCOUNTER — Telehealth: Payer: Self-pay | Admitting: Internal Medicine

## 2017-06-30 NOTE — Telephone Encounter (Signed)
Patient with diarrhea after meals. He will come in tomorrow and see Dr. Carlean Purl at 11:30

## 2017-06-30 NOTE — Telephone Encounter (Signed)
Pt's wife Ray Shelton calling requesting a appt asap qith Dr. Carlean Purl for pt. He has been having loose stools everytime he eats. I offered to see a PA but she declined. Pls call her

## 2017-07-01 ENCOUNTER — Encounter (INDEPENDENT_AMBULATORY_CARE_PROVIDER_SITE_OTHER): Payer: Self-pay

## 2017-07-01 ENCOUNTER — Ambulatory Visit: Payer: BLUE CROSS/BLUE SHIELD | Admitting: Internal Medicine

## 2017-07-01 ENCOUNTER — Encounter: Payer: Self-pay | Admitting: Internal Medicine

## 2017-07-01 VITALS — BP 130/90 | HR 64 | Ht 67.0 in | Wt 191.0 lb

## 2017-07-01 DIAGNOSIS — Z86010 Personal history of colonic polyps: Secondary | ICD-10-CM | POA: Diagnosis not present

## 2017-07-01 DIAGNOSIS — K591 Functional diarrhea: Secondary | ICD-10-CM

## 2017-07-01 DIAGNOSIS — F439 Reaction to severe stress, unspecified: Secondary | ICD-10-CM | POA: Diagnosis not present

## 2017-07-01 MED ORDER — DICYCLOMINE HCL 20 MG PO TABS: 20.0000 mg | ORAL_TABLET | Freq: Three times a day (TID) | ORAL | 3 refills | Status: DC

## 2017-07-01 NOTE — Patient Instructions (Signed)
  We have sent the following medications to your pharmacy for you to pick up at your convenience: Dicyclomine  Follow up with Dr. Carlean Purl as needed.   I appreciate the opportunity to care for you. Silvano Rusk, MD, [facility]

## 2017-07-01 NOTE — Progress Notes (Signed)
Ray Shelton. 56 y.o. [birth year] [id number]  Assessment & Plan:   Encounter Diagnoses  Name Primary?  . Functional diarrhea Yes  . Situational stress   . Hx of adenomatous colonic polyps     I suspect his diarrhea is more of an irritable bowel-like phenomena related to the stress of his father's death perhaps some overlay with work.  If he were going to have a post resection diarrhea I would have expected it started closer to the time of his resection.  We will try dicyclomine 20 mg q. before meals.  He may not need it that often.  I explained this.  If that fails to work he is to let me know and we can consider other treatments which could include antidiarrheals like Imodium, Lomotil or perhaps a bile salt binder like cholestyramine or colestipol.  Recall colonoscopy about August or September of this year given history of resection of large adenomatous polyp with high-grade dysplasia in the right colon..  I appreciate the opportunity to care for this patient.   Subjective:   Chief Complaint: Diarrhea  HPI The patient reports a month or so of diarrhea described as urgent postprandial defecation multiple times throughout the day.  Bristol stool scale type IV mostly.  This seems to have started after his father died.  His father became ill and had to go into a nursing facility, and then he aspirated and despite plans not to go on a ventilator or have a gastrostomy tube both of those were done and then when he was extubated [name] was with him and it was traumatic.  He is still grieving.  There is no bleeding.  He had a laparoscopic-assisted right ileocolectomy in September of last year and did not have any problems with bowel movements until now.  No nocturnal symptoms.  No significant abdominal pain. No Known Allergies Current Meds  Medication Sig  . acetaminophen (TYLENOL) 500 MG tablet Take 2 tablets (1,000 mg total) by mouth every 6 (six) hours as needed.  Marland Kitchen esomeprazole  (NEXIUM) 20 MG capsule Take 20 mg by mouth 2 (two) times daily before a meal.   Past Medical History:  Diagnosis Date  . Asthma    as child only   . GERD (gastroesophageal reflux disease)   . 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 cololoscopy     . LAPAROSCOPIC PARTIAL COLECTOMY N/A 11/04/2016   Procedure: LAPAROSCOPIC PARTIAL COLECTOMY;  Surgeon: Greer Pickerel, MD;  Location: [facility];  Service: General;  Laterality: N/A;  . UPPER GASTROINTESTINAL ENDOSCOPY     [place]    Social History   Social History Narrative   Married   Engineer, [employer] in [place]   Approximately 1 beer a day no tobacco no drug use   Some coffee consumption   family history includes Colon cancer in his maternal grandfather; Dementia in his father.   Review of Systems As above  Objective:   Physical Exam BP 130/90   Pulse 64   Ht 5\' 7"  (1.702 m)   Wt 191 lb (86.6 kg)   BMI 29.91 kg/m  No acute distress Abdomen is soft and nontender without organomegaly or mass  15 minutes time spent with patient > half in counseling coordination of care

## 2017-07-20 ENCOUNTER — Encounter: Payer: Self-pay | Admitting: Internal Medicine

## 2017-09-22 ENCOUNTER — Ambulatory Visit (AMBULATORY_SURGERY_CENTER): Payer: Self-pay

## 2017-09-22 VITALS — Ht 67.5 in | Wt 189.8 lb

## 2017-09-22 DIAGNOSIS — Z86010 Personal history of colonic polyps: Secondary | ICD-10-CM

## 2017-09-22 NOTE — Progress Notes (Signed)
Denies allergies to eggs or soy products. Denies complication of anesthesia or sedation. Denies use of weight loss medication. Denies use of O2.   Emmi instructions declined.  

## 2017-09-25 ENCOUNTER — Encounter: Payer: Self-pay | Admitting: Internal Medicine

## 2017-10-06 ENCOUNTER — Ambulatory Visit (AMBULATORY_SURGERY_CENTER): Payer: BLUE CROSS/BLUE SHIELD | Admitting: Internal Medicine

## 2017-10-06 ENCOUNTER — Encounter: Payer: Self-pay | Admitting: Internal Medicine

## 2017-10-06 VITALS — BP 128/87 | HR 67 | Temp 97.5°F | Resp 13 | Ht 67.0 in | Wt 189.0 lb

## 2017-10-06 DIAGNOSIS — D123 Benign neoplasm of transverse colon: Secondary | ICD-10-CM

## 2017-10-06 DIAGNOSIS — Z86010 Personal history of colonic polyps: Secondary | ICD-10-CM

## 2017-10-06 DIAGNOSIS — R748 Abnormal levels of other serum enzymes: Secondary | ICD-10-CM

## 2017-10-06 DIAGNOSIS — D124 Benign neoplasm of descending colon: Secondary | ICD-10-CM

## 2017-10-06 HISTORY — DX: Abnormal levels of other serum enzymes: R74.8

## 2017-10-06 MED ORDER — SODIUM CHLORIDE 0.9 % IV SOLN: 500.0000 mL | Freq: Once | INTRAVENOUS | Status: DC

## 2017-10-06 NOTE — Progress Notes (Signed)
Report to PACU, RN, vss, BBS= Clear.  

## 2017-10-06 NOTE — Op Note (Signed)
[name] Patient Name: Ray Shelton Procedure Date: 10/06/2017 10:58 AM MRN: [id number] Endoscopist: Gatha Mayer , MD Age: 57 Referring MD:  Date of Birth: 17-Jun-1960 Gender: Male Account #: [id number] Procedure:                Colonoscopy Indications:              Diarrhea, Change in bowel habits Medicines:                Propofol per Anesthesia, Monitored Anesthesia Care Procedure:                Pre-Anesthesia Assessment:                           - Prior to the procedure, a History and Physical                            was performed, and patient medications and                            allergies were reviewed. The patient's tolerance of                            previous anesthesia was also reviewed. The risks                            and benefits of the procedure and the sedation                            options and risks were discussed with the patient.                            All questions were answered, and informed consent                            was obtained. Prior Anticoagulants: The patient has                            taken no previous anticoagulant or antiplatelet                            agents. ASA Grade Assessment: II - A patient with                            mild systemic disease. After reviewing the risks                            and benefits, the patient was deemed in                            satisfactory condition to undergo the procedure.                           After obtaining informed consent, the colonoscope  was passed under direct vision. Throughout the                            procedure, the patient's blood pressure, pulse, and                            oxygen saturations were monitored continuously. The                            Model CF-HQ190L [id number]) scope was introduced                            through the anus and advanced to the the                            ileocolonic  anastomosis. The colonoscopy was                            performed without difficulty. The patient tolerated                            the procedure well. The quality of the bowel                            preparation was good. The rectum and Ileocolonic                            anastomsis areas were photographed. The bowel                            preparation used was Miralax. Scope In: 11:24:19 AM Scope Out: 11:39:02 AM Scope Withdrawal Time: 0 hours 11 minutes 58 seconds  Total Procedure Duration: 0 hours 14 minutes 43 seconds  Findings:                 The perianal and digital rectal examinations were                            normal. Pertinent negatives include normal prostate                            (size, shape, and consistency).                           A 5 mm polyp was found in the descending colon. The                            polyp was sessile. The polyp was removed with a                            cold snare. Resection and retrieval were complete.                            Verification of patient identification for the  specimen was done. Estimated blood loss was minimal.                           A 1 to 2 mm polyp was found in the transverse                            colon. The polyp was sessile. The polyp was removed                            with a cold biopsy forceps. Resection and retrieval                            were complete. Verification of patient                            identification for the specimen was done. Estimated                            blood loss was minimal.                           Multiple diverticula were found in the left colon.                           The neo-terminal ileum appeared normal.                           The exam was otherwise without abnormality on                            direct and retroflexion views.                           Biopsies for histology were taken with a cold                             forceps from the entire colon for evaluation of                            microscopic colitis. Estimated blood loss was                            minimal. Complications:            No immediate complications. Estimated Blood Loss:     Estimated blood loss was minimal. Impression:               - One 5 mm polyp in the descending colon, removed                            with a cold snare. Resected and retrieved.                           - One 1 to 2 mm polyp in the transverse colon,  removed with a cold biopsy forceps. Resected and                            retrieved.                           - Diverticulosis in the left colon.                           - The examined portion of the ileum was normal.                           - The examination was otherwise normal on direct                            and retroflexion views.                           - Biopsies were taken with a cold forceps from the                            entire colon for evaluation of microscopic colitis.                           - Personal history of colonic polyps including                            large adenoma HGD resected last year                           Abnormal transaminases - having PCP f/u if remain                            up I can see him. Recommendation:           - Patient has a contact number available for                            emergencies. The signs and symptoms of potential                            delayed complications were discussed with the                            patient. Return to normal activities tomorrow.                            Written discharge instructions were provided to the                            patient.                           - Resume previous diet.                           -  Continue present medications.                           - Repeat colonoscopy is recommended. The                             colonoscopy date will be determined after pathology                            results from today's exam become available for                            review. Gatha Mayer, MD 10/06/2017 11:56:42 AM This report has been signed electronically.

## 2017-10-06 NOTE — Patient Instructions (Addendum)
Two small polyps seen and removed. You  have diverticulosis - thickened muscle rings and pouches in the colon wall. Please read the handout about this condition.  No other abnormalities. I did take colon biopsies to see if you have any microscopic inflammation.   I see that your transaminases (liver chemistries) are elevated. Repeating these with PCP makes sens and if persistently elevated I can evaluate further.  I appreciate the opportunity to care for you. Gatha Mayer, MD, FACG   YOU HAD AN ENDOSCOPIC PROCEDURE TODAY AT [facility]:   Refer to the procedure report that was given to you for any specific questions about what was found during the examination.  If the procedure report does not answer your questions, please call your gastroenterologist to clarify.  If you requested that your care partner not be given the details of your procedure findings, then the procedure report has been included in a sealed envelope for you to review at your convenience later.  YOU SHOULD EXPECT: Some feelings of bloating in the abdomen. Passage of more gas than usual.  Walking can help get rid of the air that was put into your GI tract during the procedure and reduce the bloating. If you had a lower endoscopy (such as a colonoscopy or flexible sigmoidoscopy) you may notice spotting of blood in your stool or on the toilet paper. If you underwent a bowel prep for your procedure, you may not have a normal bowel movement for a few days.  Please Note:  You might notice some irritation and congestion in your nose or some drainage.  This is from the oxygen used during your procedure.  There is no need for concern and it should clear up in a day or so.  SYMPTOMS TO REPORT IMMEDIATELY:   Following lower endoscopy (colonoscopy or flexible sigmoidoscopy):  Excessive amounts of blood in the stool  Significant tenderness or worsening of abdominal pains  Swelling of the abdomen that is new,  acute  Fever of 100F or higher   Following upper endoscopy (EGD)  Vomiting of blood or coffee ground material  New chest pain or pain under the shoulder blades  Painful or persistently difficult swallowing  New shortness of breath  Fever of 100F or higher  Black, tarry-looking stools  For urgent or emergent issues, a gastroenterologist can be reached at any hour by calling [phone].   DIET:  We do recommend a small meal at first, but then you may proceed to your regular diet.  Drink plenty of fluids but you should avoid alcoholic beverages for 24 hours.  ACTIVITY:  You should plan to take it easy for the rest of today and you should NOT DRIVE or use heavy machinery until tomorrow (because of the sedation medicines used during the test).    FOLLOW UP: Our staff will call the number listed on your records the next business day following your procedure to check on you and address any questions or concerns that you may have regarding the information given to you following your procedure. If we do not reach you, we will leave a message.  However, if you are feeling well and you are not experiencing any problems, there is no need to return our call.  We will assume that you have returned to your regular daily activities without incident.  If any biopsies were taken you will be contacted by phone or by letter within the next 1-3 weeks.  Please call us  at [phone] if you have not heard about the biopsies in 3 weeks.    Polyp and diverticulosis information given.   SIGNATURES/CONFIDENTIALITY: You and/or your care partner have signed paperwork which will be entered into your electronic medical record.  These signatures attest to the fact that that the information above on your After Visit Summary has been reviewed and is understood.  Full responsibility of the confidentiality of this discharge information lies with you and/or your care-partner.

## 2017-10-06 NOTE — Progress Notes (Signed)
Called to room to assist during endoscopic procedure.  Patient ID and intended procedure confirmed with present staff. Received instructions for my participation in the procedure from the performing physician.  

## 2017-10-06 NOTE — Progress Notes (Signed)
Pt's states no medical or surgical changes since previsit or office visit. 

## 2017-10-07 ENCOUNTER — Telehealth: Payer: Self-pay

## 2017-10-07 NOTE — Telephone Encounter (Signed)
  Follow up Call-  Call back number 10/06/2017 10/01/2016  Post procedure Call Back phone  # [phone]  Permission to leave phone message Yes Yes     Patient questions:  Do you have a fever, pain , or abdominal swelling? No. Pain Score  0 *  Have you tolerated food without any problems? Yes.    Have you been able to return to your normal activities? Yes.    Do you have any questions about your discharge instructions: Diet   No. Medications  No. Follow up visit  No.  Do you have questions or concerns about your Care? No.  Actions: * If pain score is 4 or above: No action needed, pain <4.

## 2017-10-12 NOTE — Progress Notes (Signed)
Strum 3 yr recall No letter  Office  1) Tell him 2 benign polyps 2) No inflammation on bxs 3) Try cholestyramine 4 g (1 scoop) with supper to treat diarrhea disp # 1 can no refill 4) Repeat colon 2022 5) He should let me know results of cholestyramine (it binds up bile that may get through after intesine resection)

## 2017-10-13 ENCOUNTER — Other Ambulatory Visit: Payer: Self-pay

## 2017-10-13 MED ORDER — CHOLESTYRAMINE 4 GM/DOSE PO POWD: 4.0000 g | Freq: Every day | ORAL | 0 refills | Status: DC

## 2018-01-26 ENCOUNTER — Ambulatory Visit: Payer: BLUE CROSS/BLUE SHIELD | Admitting: Internal Medicine

## 2018-01-26 ENCOUNTER — Encounter: Payer: Self-pay | Admitting: Internal Medicine

## 2018-01-26 VITALS — BP 114/88 | HR 108 | Ht 65.75 in | Wt 188.5 lb

## 2018-01-26 DIAGNOSIS — K032 Erosion of teeth: Secondary | ICD-10-CM | POA: Diagnosis not present

## 2018-01-26 DIAGNOSIS — K219 Gastro-esophageal reflux disease without esophagitis: Secondary | ICD-10-CM

## 2018-01-26 MED ORDER — ESOMEPRAZOLE MAGNESIUM 40 MG PO CPDR: 40.0000 mg | DELAYED_RELEASE_CAPSULE | Freq: Two times a day (BID) | ORAL | 2 refills | Status: DC

## 2018-01-26 NOTE — Progress Notes (Signed)
Ray Shelton. 57 y.o. 1960-09-01 [id number]  Assessment & Plan:   Encounter Diagnoses  Name Primary?  . Gastroesophageal reflux disease, esophagitis presence not specified Yes  . Dental erosion     It certainly seems possible that he is having reflux causing his dental erosions.  Do not think an EGD would change anything at this point.  Plan is to treat with twice daily generic Nexium 40 mg Reflux diet reviewed and handout provided and he should follow that and also consider elevating head of bed.  I agree that nocturnal reflux may be the culprit or so than daytime with respect to his dental erosions. Barium swallow to evaluate reflux, question if he could be a candidate for TIF Further plans pending the barium swallow. I explained this is difficult in that measurement of acid reflux with either a bravo attached to the esophagus or a 24-hour pH.probe plus/ minus impedance would be necessary to understand if we are adequately suppressing his acid.  That is costly, cumbersome and time-consuming.  We will try to work this up efficiently and solve his problem.  I appreciate the opportunity to care for this patient. CC: Sherrilee Gilles, DO    Subjective:   Chief Complaint: Reflux and dental problems  HPI Ray Shelton presents today at the urging of his dentist, he has erosions in his teeth and may need dental work, the dentist suspects reflux is causing this.  He does have heartburn and reflux problems and 10 years ago he had an upper endoscopy and it was without abnormalities he believes.  Asked him if anybody mention the words Barrett's esophagus and he clearly says no that was never known to be a problem.  Occasionally food may try to go into the airway and he turns to avoid that but he does not have any esophageal dysphagia.  He has been on over-the-counter Prevacid 15 mg daily and in the past 2 weeks he has been taking it daily and at bedtime.  Thinks there is less heartburn that  way.   No Known Allergies Current Meds  Medication Sig  . [DISCONTINUED] lansoprazole (PREVACID SOLUTAB) 15 MG disintegrating tablet Take 15 mg by mouth daily at 12 noon.   Past Medical History:  Diagnosis Date  . Abnormal transaminases 10/06/2017  . Allergy   . Asthma    as child only   . GERD (gastroesophageal reflux disease)   . 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 COLONOSCOPY     multiple  . LAPAROSCOPIC PARTIAL COLECTOMY N/A 11/04/2016   Procedure: LAPAROSCOPIC PARTIAL COLECTOMY;  Surgeon: Greer Pickerel, MD;  Location: [facility];  Service: General;  Laterality: N/A;  . UPPER GASTROINTESTINAL ENDOSCOPY     [place]    Social History   Social History Narrative   Married   Engineer, [employer] in [place]   Approximately 1 beer a day no tobacco no drug use   Some coffee consumption   Hobbies: Racing car, Oceanographer, races at [employer]   family history includes Colon cancer in his maternal grandfather; Dementia in his father.   Review of Systems As above  Objective:   Physical Exam BP 114/88 (BP Location: Left Arm, Patient Position: Sitting, Cuff Size: Normal)   Pulse (!) 108   Ht 5' 5.75" (  1.67 m) Comment: height measured without shoes  Wt 188 lb 8 oz (85.5 kg)   BMI 30.66 kg/m  No acute distress Eyes are anicteric  the mouth and teeth show some dental work and caps but in the molars I do see erosions and changes that look consistent with reflux related dental disease   15 minutes time spent with patient > half in counseling coordination of care

## 2018-01-26 NOTE — Patient Instructions (Addendum)
We have sent the following medications to your pharmacy for you to pick up at your convenience: Generic Nexium  We are giving you GERD information to read and follow.   You have been scheduled for a Barium Esophogram at [facility] on 01/28/18 at 9:30AM. Please arrive at 9:10AM prior to your appointment for registration. Make certain not to have anything to eat or drink 3 hours prior to your test. If you need to reschedule for any reason, please contact radiology at [phone] to do so. __________________________________________________________________ A barium swallow is an examination that concentrates on views of the esophagus. This tends to be a double contrast exam (barium and two liquids which, when combined, create a gas to distend the wall of the oesophagus) or single contrast (non-ionic iodine based). The study is usually tailored to your symptoms so a good history is essential. Attention is paid during the study to the form, structure and configuration of the esophagus, looking for functional disorders (such as aspiration, dysphagia, achalasia, motility and reflux) EXAMINATION You may be asked to change into a gown, depending on the type of swallow being performed. A radiologist and radiographer will perform the procedure. The radiologist will advise you of the type of contrast selected for your procedure and direct you during the exam. You will be asked to stand, sit or lie in several different positions and to hold a small amount of fluid in your mouth before being asked to swallow while the imaging is performed .In some instances you may be asked to swallow barium coated marshmallows to assess the motility of a solid food bolus. The exam can be recorded as a digital or video fluoroscopy procedure. POST PROCEDURE It will take 1-2 days for the barium to pass through your system. To facilitate this, it is important, unless otherwise directed, to increase your fluids for the next  24-48hrs and to resume your normal diet.  This test typically takes about 30 minutes to perform. __________________________________________________________________________________  I appreciate the opportunity to care for you. Silvano Rusk, MD, [facility]

## 2018-01-28 ENCOUNTER — Ambulatory Visit
Admission: RE | Admit: 2018-01-28 | Discharge: 2018-01-28 | Disposition: A | Discharge status: Home / Self Care | Payer: BLUE CROSS/BLUE SHIELD | Source: Ambulatory Visit | Attending: Internal Medicine | Admitting: Internal Medicine

## 2018-01-28 DIAGNOSIS — K219 Gastro-esophageal reflux disease without esophagitis: Secondary | ICD-10-CM

## 2018-01-28 DIAGNOSIS — K032 Erosion of teeth: Secondary | ICD-10-CM

## 2018-01-28 NOTE — Progress Notes (Signed)
There was not reflux evdient on the study but that does not mean he is not having reflux. There is a possible stomach polyp seen so we should do an EGD  Ask him to stay on the PPI bid and we should do an EGD in February - I want him on the bid PPI for a while to see what difference that makes and not urgent to do the EGD

## 2018-02-01 ENCOUNTER — Telehealth: Payer: Self-pay

## 2018-02-01 MED ORDER — OMEPRAZOLE 40 MG PO CPDR: 40.0000 mg | DELAYED_RELEASE_CAPSULE | Freq: Two times a day (BID) | ORAL | 3 refills | Status: DC

## 2018-02-01 NOTE — Telephone Encounter (Signed)
Thanks please give me a mg and sig.

## 2018-02-01 NOTE — Telephone Encounter (Signed)
Try omeprazole

## 2018-02-01 NOTE — Telephone Encounter (Signed)
The esomeprazole has been denied. They prefer either omeprazole or pantoprazole. Please advise which one he should try first, thank you.

## 2018-02-01 NOTE — Telephone Encounter (Signed)
This should really go to MD of the day.  But could order the same mg that Dr. Carlean Purl ordered for the omeprazole

## 2018-02-01 NOTE — Telephone Encounter (Signed)
Received a non-formulary exception form for patient's generic Nexium. I've spoken with Benjie and told him we need to know what they cover. He is going to check into this and let me know. I will await his call to send in the form.

## 2018-02-01 NOTE — Telephone Encounter (Signed)
I left Ray Shelton a detailed message of what we sent in.

## 2018-02-01 NOTE — Telephone Encounter (Signed)
I called his insurance company to do the prior authorization over the phone. Phone # [phone].  This was done and we are waiting to hear, could take up to 72 hours. Dx- GERD K21.9. He has only tried the over the counter prevacid 15 mg which he said he will remain on until we hear from insurance.

## 2018-02-15 ENCOUNTER — Telehealth: Payer: Self-pay | Admitting: Internal Medicine

## 2018-02-15 NOTE — Telephone Encounter (Signed)
I have gotten the omeprazole 40mg  approved for the BID dosing , good from 02/15/2018-02/15/2019. Dx- GERD K21.9. I spoke to Fraser Din with his insurance company at phone # [phone]. The authorization # is [id number]. Benjie is on his wife's plan, her BD is 10/[birth year]/1958. I left him a voice mail that this has been approved. I also left a voicemail for the pharmacy that this has been approved.

## 2018-02-15 NOTE — Telephone Encounter (Signed)
I told Ray Shelton that we didn't know it had not gone thru. I called the pharmacy and they said omeprazole not approved for BID. They are resending [place] a fax with insurance information to do prior authorization.

## 2018-02-15 NOTE — Telephone Encounter (Signed)
Pt is still waiting for prior auth for omeprazole bid.

## 2018-02-17 ENCOUNTER — Telehealth: Payer: Self-pay

## 2018-02-17 NOTE — Telephone Encounter (Signed)
Patient notified EGD scheduled for 04/07/18 and pre-visit 2/11

## 2018-02-17 NOTE — Telephone Encounter (Signed)
-----   Message from Marlon Pel, RN sent at 01/29/2018  2:10 PM EST ----- Gatha Mayer, MD sent to Marlon Pel, RN    There was not reflux evdient on the study but that does not mean he is not having reflux.  There is a possible stomach polyp seen so we should do an EGD   Ask him to stay on the PPI bid and we should do an EGD in February - I want him on the bid PPI for a while to see what difference that makes and not urgent to do the EGD

## 2018-03-13 DIAGNOSIS — Q859 Phakomatosis, unspecified: Secondary | ICD-10-CM

## 2018-03-13 DIAGNOSIS — D131 Benign neoplasm of stomach: Secondary | ICD-10-CM

## 2018-03-13 HISTORY — DX: Phakomatosis, unspecified: Q85.9

## 2018-03-13 HISTORY — DX: Benign neoplasm of stomach: D13.1

## 2018-03-23 ENCOUNTER — Encounter: Payer: Self-pay | Admitting: Internal Medicine

## 2018-03-23 ENCOUNTER — Ambulatory Visit (AMBULATORY_SURGERY_CENTER): Payer: Self-pay

## 2018-03-23 VITALS — Ht 67.0 in | Wt 191.4 lb

## 2018-03-23 DIAGNOSIS — K219 Gastro-esophageal reflux disease without esophagitis: Secondary | ICD-10-CM

## 2018-03-23 NOTE — Progress Notes (Signed)
Denies allergies to eggs or soy products. Denies complication of anesthesia or sedation. Denies use of weight loss medication. Denies use of O2.   Emmi instructions declined.  

## 2018-04-07 ENCOUNTER — Ambulatory Visit (AMBULATORY_SURGERY_CENTER): Payer: BLUE CROSS/BLUE SHIELD | Admitting: Internal Medicine

## 2018-04-07 ENCOUNTER — Encounter: Payer: Self-pay | Admitting: Internal Medicine

## 2018-04-07 VITALS — BP 118/79 | HR 62 | Temp 97.3°F | Resp 15 | Ht 67.0 in | Wt 191.0 lb

## 2018-04-07 DIAGNOSIS — K317 Polyp of stomach and duodenum: Secondary | ICD-10-CM | POA: Diagnosis not present

## 2018-04-07 DIAGNOSIS — R933 Abnormal findings on diagnostic imaging of other parts of digestive tract: Secondary | ICD-10-CM

## 2018-04-07 DIAGNOSIS — K219 Gastro-esophageal reflux disease without esophagitis: Secondary | ICD-10-CM

## 2018-04-07 MED ORDER — SODIUM CHLORIDE 0.9 % IV SOLN: 500.0000 mL | Freq: Once | INTRAVENOUS | Status: DC

## 2018-04-07 NOTE — Op Note (Signed)
[facility] Patient Name: Ray Shelton Procedure Date: 04/07/2018 10:03 AM MRN: [id number] Endoscopist: Gatha Mayer , MD Age: 58 Referring MD:  Date of Birth: July 29, 1960 Gender: Male Account #: [id number] Procedure:                Upper GI endoscopy Indications:              Abnormal cine-esophagram - ? polyp or diverticulum Medicines:                Propofol per Anesthesia, Monitored Anesthesia Care Procedure:                Pre-Anesthesia Assessment:                           - Prior to the procedure, a History and Physical                            was performed, and patient medications and                            allergies were reviewed. The patient's tolerance of                            previous anesthesia was also reviewed. The risks                            and benefits of the procedure and the sedation                            options and risks were discussed with the patient.                            All questions were answered, and informed consent                            was obtained. Prior Anticoagulants: The patient has                            taken no previous anticoagulant or antiplatelet                            agents. ASA Grade Assessment: II - A patient with                            mild systemic disease. After reviewing the risks                            and benefits, the patient was deemed in                            satisfactory condition to undergo the procedure.                           After obtaining informed consent, the endoscope was  passed under direct vision. Throughout the                            procedure, the patient's blood pressure, pulse, and                            oxygen saturations were monitored continuously. The                            Model GIF-HQ190 [id number]) scope was introduced                            through the mouth, and advanced to the second part                      of duodenum. The upper GI endoscopy was                            accomplished without difficulty. The patient                            tolerated the procedure well. Scope In: Scope Out: Findings:                 A single 15 mm semi-sessile polyp was found on the                            greater curvature of the gastric body. The polyp                            was removed with a hot snare. Resection and                            retrieval were complete. Verification of patient                            identification for the specimen was done. Estimated                            blood loss was minimal. To prevent bleeding after                            the polypectomy, two hemostatic clips were                            successfully placed (MR conditional). There was no                            bleeding at the end of the maneuver.                           Two 2 to 8 mm sessile polyps with no stigmata of  recent bleeding were found in the gastric fundus.                            The polyp was removed with a cold biopsy forceps.                            Resection and retrieval were complete. Verification                            of patient identification for the specimen was                            done. Estimated blood loss was minimal.                           The exam was otherwise without abnormality.                           The cardia and gastric fundus were normal on                            retroflexion. Complications:            No immediate complications. Estimated Blood Loss:     Estimated blood loss was minimal. Impression:               - A single gastric polyp. Resected and retrieved.                            Clips (MR conditional) were placed.                           - Two gastric polyps. Resected and retrieved.                           - The examination was otherwise normal. Recommendation:            - Patient has a contact number available for                            emergencies. The signs and symptoms of potential                            delayed complications were discussed with the                            patient. Return to normal activities tomorrow.                            Written discharge instructions were provided to the                            patient.                           - Resume previous diet.                           -  Continue present medications.                           - No aspirin, ibuprofen, naproxen, or other                            non-steroidal anti-inflammatory drugs for 2 weeks                            after polyp removal.                           - Await pathology results. Gatha Mayer, MD 04/07/2018 10:42:09 AM This report has been signed electronically.

## 2018-04-07 NOTE — Progress Notes (Signed)
Pt's states no medical or surgical changes since previsit or office visit. 

## 2018-04-07 NOTE — Patient Instructions (Addendum)
I found 3 polyps - 1 removed with snare and 2 removed/biopsied with forceps.  I placed 2 clips to seal up where the one polyp was snared.  I do not think these have anything to do with your reflux.  I will let you know results and plans when I get the information.  I appreciate the opportunity to care for you. Gatha Mayer, MD, [facility]  Await pathology results.  No aspirin, ibuprofen, naproxen or other non steroidal anti-inflammatory medications for two weeks following polyp removal.  YOU HAD AN ENDOSCOPIC PROCEDURE TODAY AT [facility]:   Refer to the procedure report that was given to you for any specific questions about what was found during the examination.  If the procedure report does not answer your questions, please call your gastroenterologist to clarify.  If you requested that your care partner not be given the details of your procedure findings, then the procedure report has been included in a sealed envelope for you to review at your convenience later.  YOU SHOULD EXPECT: Some feelings of bloating in the abdomen. Passage of more gas than usual.  Walking can help get rid of the air that was put into your GI tract during the procedure and reduce the bloating. If you had a lower endoscopy (such as a colonoscopy or flexible sigmoidoscopy) you may notice spotting of blood in your stool or on the toilet paper. If you underwent a bowel prep for your procedure, you may not have a normal bowel movement for a few days.  Please Note:  You might notice some irritation and congestion in your nose or some drainage.  This is from the oxygen used during your procedure.  There is no need for concern and it should clear up in a day or so.  SYMPTOMS TO REPORT IMMEDIATELY:    Following upper endoscopy (EGD)  Vomiting of blood or coffee ground material  New chest pain or pain under the shoulder blades  Painful or persistently difficult swallowing  New shortness of  breath  Fever of 100F or higher  Black, tarry-looking stools  For urgent or emergent issues, a gastroenterologist can be reached at any hour by calling [phone].   DIET:  We do recommend a small meal at first, but then you may proceed to your regular diet.  Drink plenty of fluids but you should avoid alcoholic beverages for 24 hours.  ACTIVITY:  You should plan to take it easy for the rest of today and you should NOT DRIVE or use heavy machinery until tomorrow (because of the sedation medicines used during the test).    FOLLOW UP: Our staff will call the number listed on your records the next business day following your procedure to check on you and address any questions or concerns that you may have regarding the information given to you following your procedure. If we do not reach you, we will leave a message.  However, if you are feeling well and you are not experiencing any problems, there is no need to return our call.  We will assume that you have returned to your regular daily activities without incident.  If any biopsies were taken you will be contacted by phone or by letter within the next 1-3 weeks.  Please call us at [phone] if you have not heard about the biopsies in 3 weeks.    SIGNATURES/CONFIDENTIALITY: You and/or your care partner have signed paperwork which will be entered into your electronic  medical record.  These signatures attest to the fact that that the information above on your After Visit Summary has been reviewed and is understood.  Full responsibility of the confidentiality of this discharge information lies with you and/or your care-partner.

## 2018-04-07 NOTE — Progress Notes (Signed)
Report to PACU, RN, vss, BBS= Clear.  

## 2018-04-07 NOTE — Progress Notes (Signed)
Called to room to assist during endoscopic procedure.  Patient ID and intended procedure confirmed with present staff. Received instructions for my participation in the procedure from the performing physician.  

## 2018-04-08 ENCOUNTER — Telehealth: Payer: Self-pay

## 2018-04-08 NOTE — Telephone Encounter (Signed)
  Follow up Call-  Call back number 04/07/2018 10/06/2017 10/01/2016  Post procedure Call Back phone  # [phone]  Permission to leave phone message Yes Yes Yes     Patient questions:  Do you have a fever, pain , or abdominal swelling? No. Pain Score  0 *  Have you tolerated food without any problems? Yes.    Have you been able to return to your normal activities? Yes.    Do you have any questions about your discharge instructions: Diet   No. Medications  No. Follow up visit  No.  Do you have questions or concerns about your Care? No.  Actions: * If pain score is 4 or above: No action needed, pain <4.

## 2018-04-15 ENCOUNTER — Encounter: Payer: Self-pay | Admitting: Internal Medicine

## 2018-04-15 NOTE — Progress Notes (Signed)
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

## 2018-04-21 ENCOUNTER — Telehealth: Payer: Self-pay | Admitting: Internal Medicine

## 2018-04-21 NOTE — Telephone Encounter (Signed)
Pt wife calling for husbands pathology results.

## 2018-04-22 NOTE — Telephone Encounter (Signed)
I reviewed the results with the patient.  All questions answered.  He will call back for additional questions or concerns.

## 2018-04-22 NOTE — Telephone Encounter (Signed)
Pt wife following back up on the previous msg

## 2019-03-03 ENCOUNTER — Other Ambulatory Visit: Payer: Self-pay

## 2019-03-03 MED ORDER — OMEPRAZOLE 40 MG PO CPDR: 40.0000 mg | DELAYED_RELEASE_CAPSULE | Freq: Two times a day (BID) | ORAL | 3 refills | Status: DC

## 2019-03-03 NOTE — Telephone Encounter (Signed)
Omeprazole refilled as pharmacy requested.  

## 2019-12-03 IMAGING — RF DG ESOPHAGUS
6 series · 14 of 19 positions shown · non-contrast
Comparison: None.

CLINICAL DATA: Gastroesophageal reflux disease, poor dentition

EXAM:
ESOPHOGRAM / BARIUM SWALLOW / BARIUM TABLET STUDY
TECHNIQUE: Combined double contrast and single contrast examination performed
using effervescent crystals, thick barium liquid, and thin barium
liquid. The patient was observed with fluoroscopy swallowing a 13 mm
barium sulphate tablet.
FLUOROSCOPY TIME:  Fluoroscopy Time:  1 minute 36 seconds
Radiation Exposure Index (if provided by the fluoroscopic device):
197 mGy
Number of Acquired Spot Images: 0

[Series 1: sequence · 0.28mm/px · 3 of 10 frames shown (1 of 5)]
[frame 1/10]
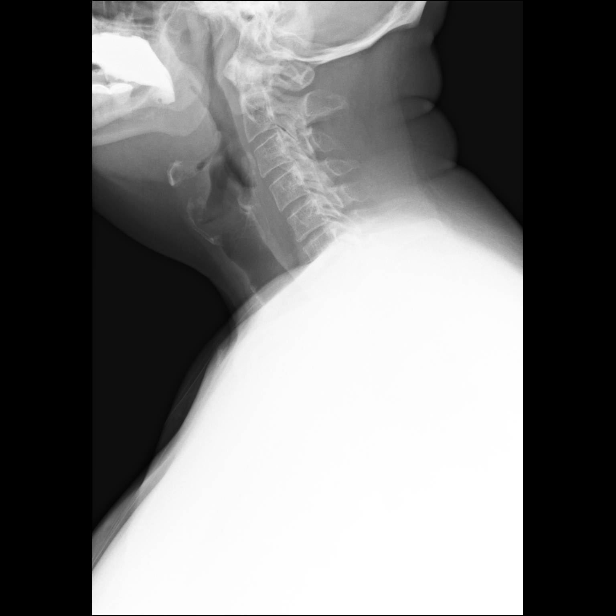
[frame 6/10]
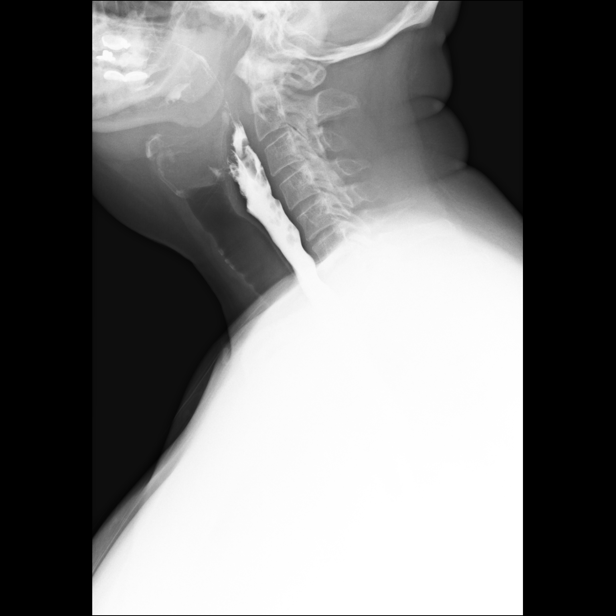
[frame 9/10]
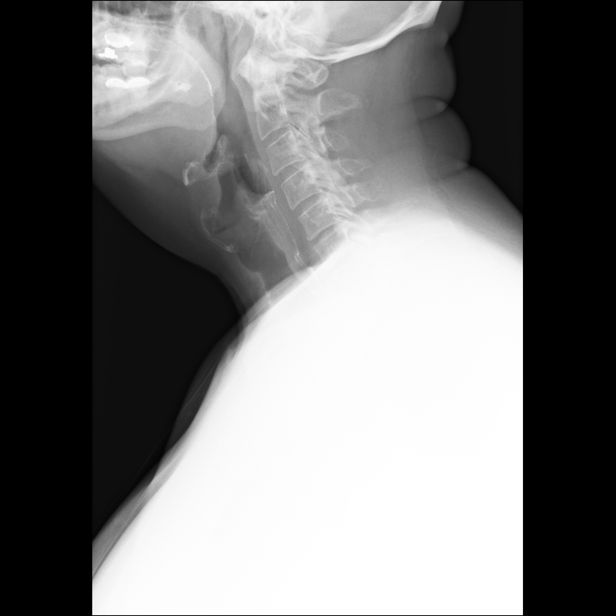

[Series 2: sequence · 0.28mm/px · 2 of 9 frames shown (2 of 5)]
[frame 2/9]
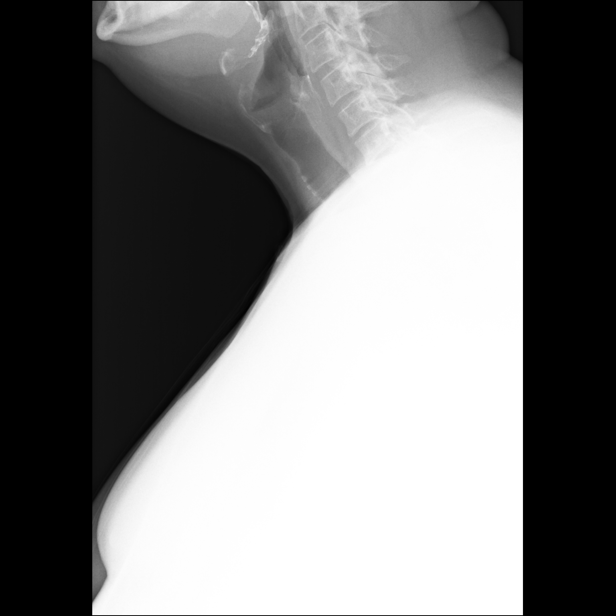
[frame 8/9]
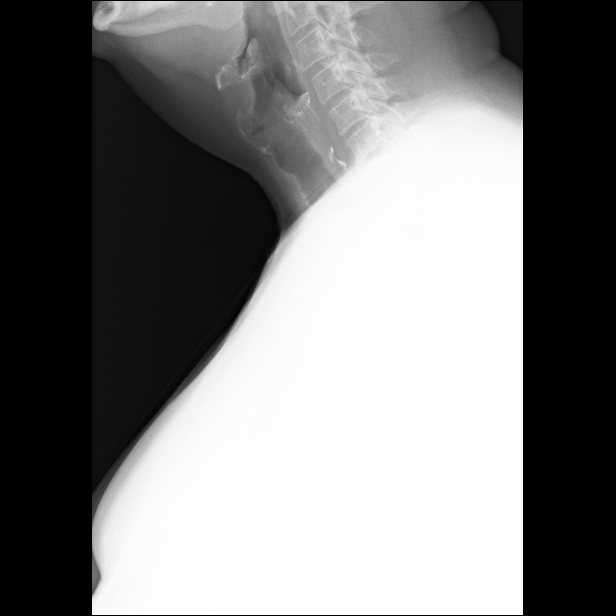

[Series 3: sequence · 0.28mm/px · 3 of 27 frames shown (3 of 5)]
[frame 4/27]
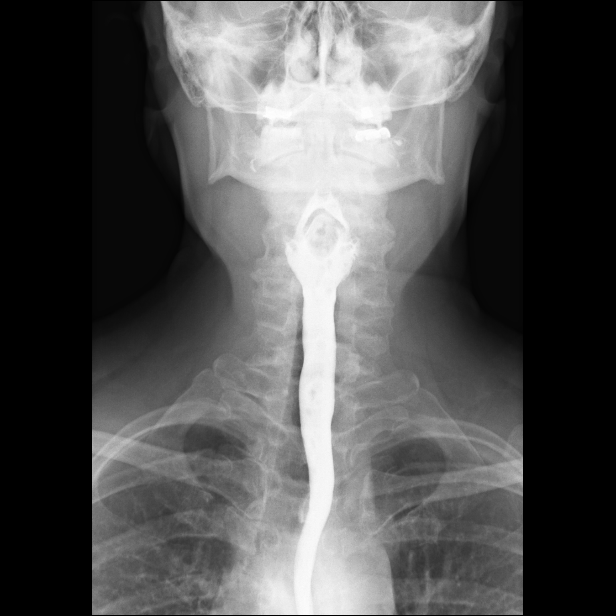
[frame 5/27]
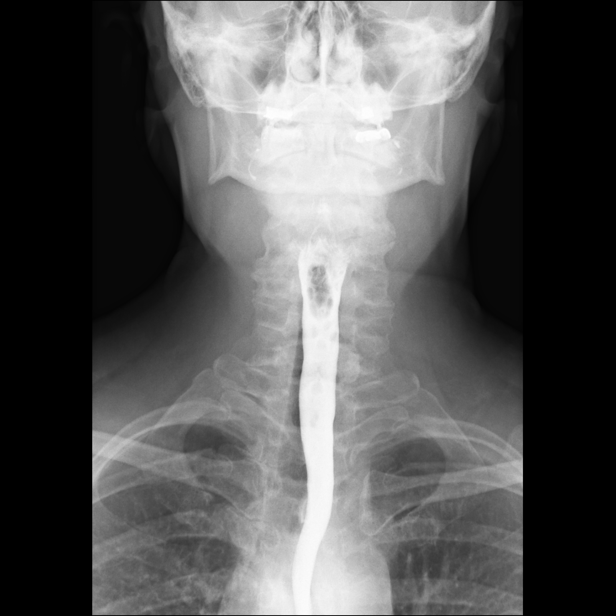
[frame 23/27]
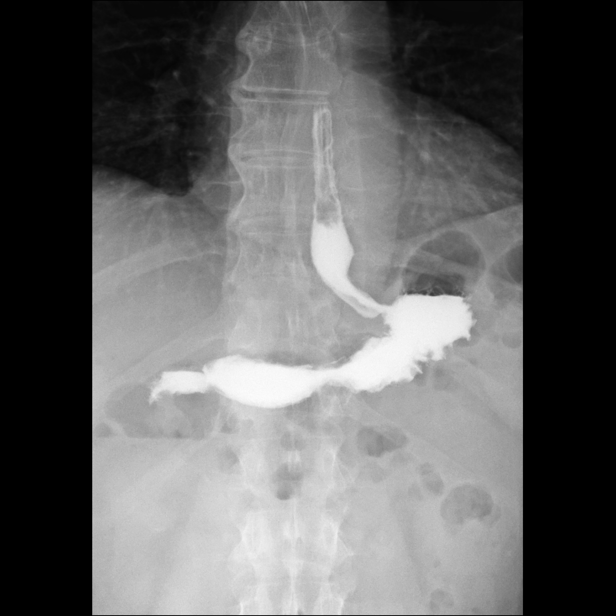

[Series 4: sequence · 0.28mm/px · 2 of 16 frames shown (4 of 5)]
[frame 3/16]
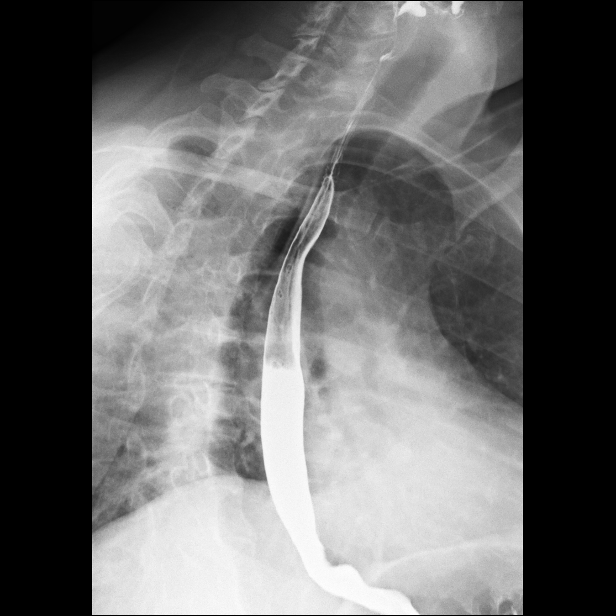
[frame 9/16]
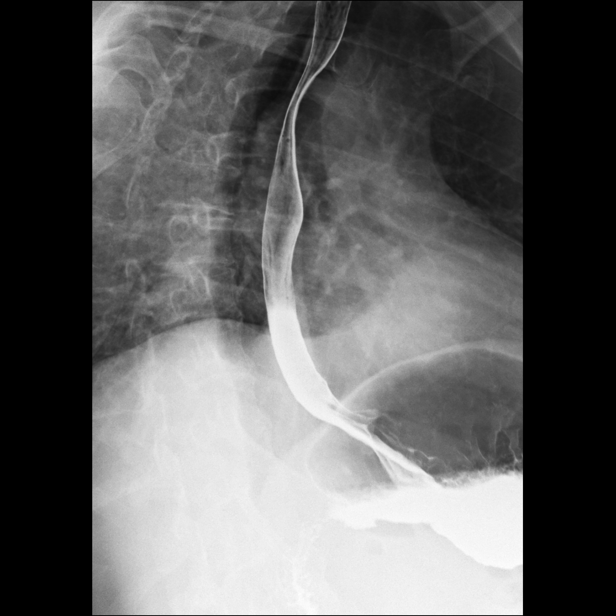

[Series 5: sequence · 0.28mm/px · 3 of 20 frames shown (5 of 5)]
[frame 4/20]
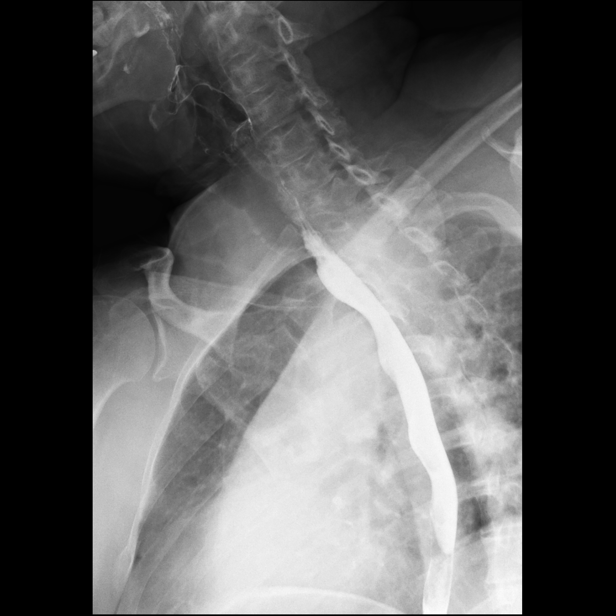
[frame 10/20]
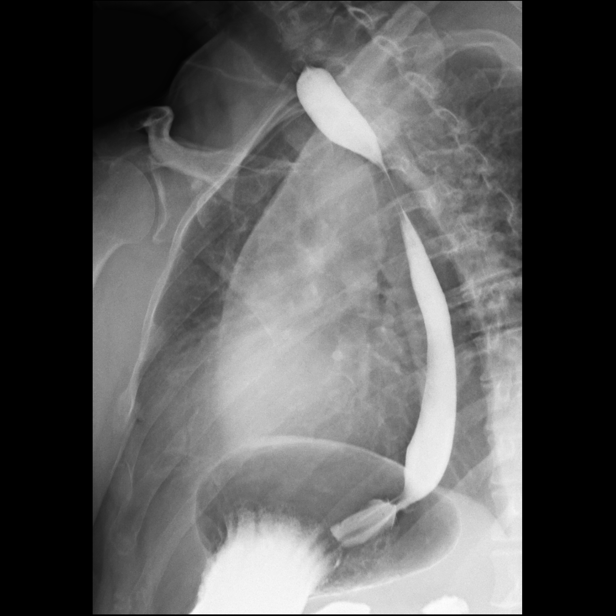
[frame 11/20]
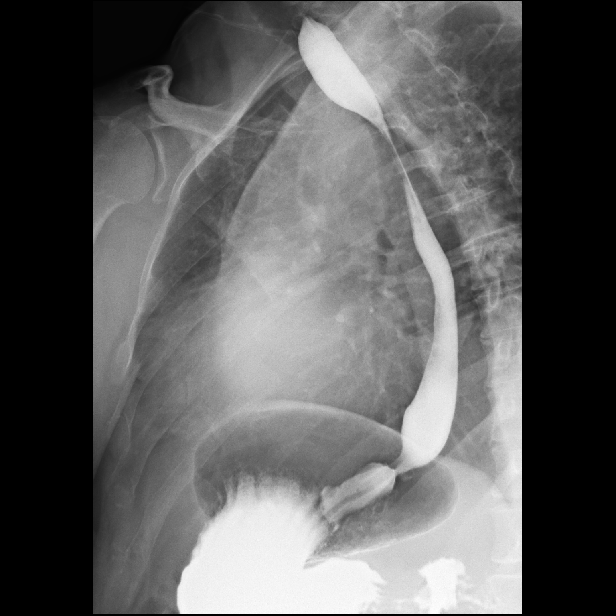

[Series 6: one shot · 1 of 1 slices shown]
[im 1/1]
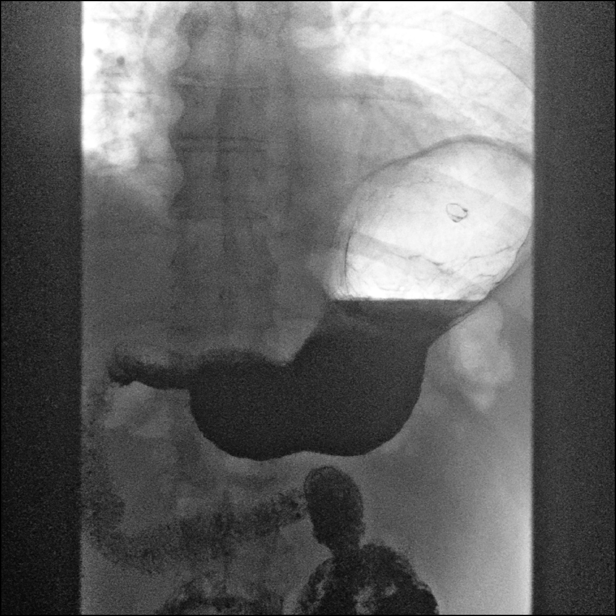

[14 of 19 positions shown; findings below may reference images not displayed]

FINDINGS: With the patient giving a history of intermittent coughing after
swallowing, the study was begun in the lateral projection with
single contrast. The swallowing mechanism appears normal. No
penetration or aspiration is seen. No intrinsic or extrinsic
cervical esophageal abnormality is noted.

A double-contrast study was then performed. The mucosa of the
esophagus is unremarkable. No mucosal lesion is seen.

Rapid sequence spot films of the cervical esophagus show the
swallowing mechanism to be normal. No gastroesophageal reflux could
be demonstrated and no hiatal hernia is evident. A barium pill was
given at the end of the study which slightly delayed in passing into
the stomach but did pass intact.

Incidentally there is a small focal lesion within the gastric
fundus. This could represent a small gastric fundal diverticulum,
but a polypoid lesion is difficult to exclude on this study.
IMPRESSION: 1. Negative double-contrast barium swallow. No hiatal hernia. No
gastroesophageal reflux could be demonstrated.
2. Incidental small lesion in the fundus of the stomach may indicate
a diverticulum, but a small gastric polyp cannot be excluded.
3. The barium pill does slightly delay in passing into the stomach
but does pass intact. Very mild narrowing of the distal esophagus
cannot be excluded.

## 2020-06-04 ENCOUNTER — Other Ambulatory Visit: Payer: Self-pay | Admitting: Internal Medicine

## 2020-06-06 ENCOUNTER — Other Ambulatory Visit: Payer: Self-pay | Admitting: Internal Medicine

## 2020-06-20 ENCOUNTER — Telehealth: Payer: Self-pay | Admitting: Internal Medicine

## 2020-06-20 NOTE — Telephone Encounter (Signed)
Patient  Is due for a 3 year recall for August.  Please assist him in scheduling.

## 2020-06-20 NOTE — Telephone Encounter (Signed)
Could you pls confirm when pt is due for a recall colon? His wife Jana Half called stating that he is due this year in August. Thank you.

## 2020-06-21 ENCOUNTER — Encounter: Payer: Self-pay | Admitting: Internal Medicine

## 2020-07-02 ENCOUNTER — Ambulatory Visit: Payer: BLUE CROSS/BLUE SHIELD | Admitting: Dermatology

## 2020-09-01 ENCOUNTER — Other Ambulatory Visit: Payer: Self-pay | Admitting: Internal Medicine

## 2020-09-21 ENCOUNTER — Other Ambulatory Visit: Payer: Self-pay

## 2020-09-21 ENCOUNTER — Ambulatory Visit (AMBULATORY_SURGERY_CENTER): Payer: Self-pay | Admitting: *Deleted

## 2020-09-21 VITALS — Ht 67.0 in | Wt 185.0 lb

## 2020-09-21 DIAGNOSIS — Z86010 Personal history of colonic polyps: Secondary | ICD-10-CM

## 2020-09-21 NOTE — Progress Notes (Signed)

## 2020-10-05 ENCOUNTER — Encounter: Payer: Self-pay | Admitting: Internal Medicine

## 2020-10-05 ENCOUNTER — Ambulatory Visit (AMBULATORY_SURGERY_CENTER): Payer: BC Managed Care – PPO | Admitting: Internal Medicine

## 2020-10-05 ENCOUNTER — Other Ambulatory Visit: Payer: Self-pay

## 2020-10-05 VITALS — BP 124/66 | HR 65 | Temp 98.4°F | Resp 15 | Ht 67.0 in | Wt 185.0 lb

## 2020-10-05 DIAGNOSIS — K621 Rectal polyp: Secondary | ICD-10-CM | POA: Diagnosis not present

## 2020-10-05 DIAGNOSIS — Z86010 Personal history of colonic polyps: Secondary | ICD-10-CM | POA: Diagnosis not present

## 2020-10-05 DIAGNOSIS — D128 Benign neoplasm of rectum: Secondary | ICD-10-CM

## 2020-10-05 MED ORDER — CHOLESTYRAMINE 4 G PO PACK: 4.0000 g | PACK | Freq: Every day | ORAL | 3 refills | Status: AC

## 2020-10-05 MED ORDER — SODIUM CHLORIDE 0.9 % IV SOLN: 500.0000 mL | INTRAVENOUS | Status: DC

## 2020-10-05 NOTE — Progress Notes (Signed)
Sedate, gd SR's, VSS, report to RN 

## 2020-10-05 NOTE — Op Note (Addendum)
[facility] Patient Name: Ray Shelton Procedure Date: 10/05/2020 10:09 AM MRN: [id number] Endoscopist: Gatha Mayer , MD Age: 60 Referring MD:  Date of Birth: Jan 11, 1961 Gender: Male Account #: [id number] Procedure:                Colonoscopy Indications:              Surveillance: Personal history of adenomatous                            polyps on last colonoscopy 3 years ago Medicines:                Propofol per Anesthesia, Monitored Anesthesia Care Procedure:                Pre-Anesthesia Assessment:                           - Prior to the procedure, a History and Physical                            was performed, and patient medications and                            allergies were reviewed. The patient's tolerance of                            previous anesthesia was also reviewed. The risks                            and benefits of the procedure and the sedation                            options and risks were discussed with the patient.                            All questions were answered, and informed consent                            was obtained. Prior Anticoagulants: The patient has                            taken no previous anticoagulant or antiplatelet                            agents. ASA Grade Assessment: II - A patient with                            mild systemic disease. After reviewing the risks                            and benefits, the patient was deemed in                            satisfactory condition to undergo the procedure.  After obtaining informed consent, the colonoscope                            was passed under direct vision. Throughout the                            procedure, the patient's blood pressure, pulse, and                            oxygen saturations were monitored continuously. The                            Olympus CF-HQ190L [id number]) Colonoscope was                             introduced through the anus and advanced to the the                            cecum, identified by appendiceal orifice and                            ileocecal valve. The colonoscopy was performed                            without difficulty. The patient tolerated the                            procedure well. The quality of the bowel                            preparation was good. The rectum and Ileocolonic                            anastomsis areas were photographed. The bowel                            preparation used was Miralax via split dose                            instruction. Scope In: 10:38:18 AM Scope Out: 10:53:24 AM Scope Withdrawal Time: 0 hours 12 minutes 30 seconds  Total Procedure Duration: 0 hours 15 minutes 6 seconds  Findings:                 The perianal and digital rectal examinations were                            normal. Pertinent negatives include normal prostate                            (size, shape, and consistency).                           A 1 to 2 mm polyp was found in the rectum. The  polyp was sessile. The polyp was removed with a                            cold biopsy forceps. Resection and retrieval were                            complete. Verification of patient identification                            for the specimen was done. Estimated blood loss was                            minimal.                           There was evidence of a prior end-to-side                            ileo-colonic anastomosis in the ascending colon.                            This was patent and was characterized by healthy                            appearing mucosa.                           Multiple diverticula were found in the sigmoid                            colon.                           The exam was otherwise without abnormality on                            direct and retroflexion views. Complications:            No  immediate complications. Estimated Blood Loss:     Estimated blood loss was minimal. Impression:               - One 1 to 2 mm polyp in the rectum, removed with a                            cold biopsy forceps. Resected and retrieved.                           - Patent end-to-side ileo-colonic anastomosis,                            characterized by healthy appearing mucosa.                           - Diverticulosis in the sigmoid colon.                           -  The examination was otherwise normal on direct                            and retroflexion views.                           - Personal history of colonic polyps.                           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 The signs and symptoms of potential                            delayed complications were discussed with the                            patient. Return to normal activities tomorrow.                            Written discharge instructions were provided to the                            patient.                           - Resume previous diet.                           - Continue present medications.                           - Await pathology results.                           - Repeat colonoscopy in 5 years for surveillance.                            Cholestyramine for post R hemi-colectomy diarrhea Gatha Mayer, MD 10/05/2020 11:00:34 AM This report has been signed electronically.

## 2020-10-05 NOTE — Progress Notes (Signed)
Called to room to assist during endoscopic procedure.  Patient ID and intended procedure confirmed with present staff. Received instructions for my participation in the procedure from the performing physician.  

## 2020-10-05 NOTE — Patient Instructions (Addendum)
One tiny polyp removed. Thinking recheck in 5 years. I will let you know pathology results and when to have another routine colonoscopy by mail and/or My Chart.  I sent this medication in to help diarrhea - if it is not helping or causes problems let me know. Should firm up and reduce the stools. If it constipates try 1/2 packet or every other day  Meds ordered this encounter        cholestyramine (QUESTRAN) 4 g packet    Sig: Take 1 packet (4 g total) by mouth daily with supper.    Dispense:  90 each    Refill:  3   I appreciate the opportunity to care for you. Gatha Mayer, MD, FACG  YOU HAD AN ENDOSCOPIC PROCEDURE TODAY AT [facility]:   Refer to the procedure report that was given to you for any specific questions about what was found during the examination.  If the procedure report does not answer your questions, please call your gastroenterologist to clarify.  If you requested that your care partner not be given the details of your procedure findings, then the procedure report has been included in a sealed envelope for you to review at your convenience later.  YOU SHOULD EXPECT: Some feelings of bloating in the abdomen. Passage of more gas than usual.  Walking can help get rid of the air that was put into your GI tract during the procedure and reduce the bloating. If you had a lower endoscopy (such as a colonoscopy or flexible sigmoidoscopy) you may notice spotting of blood in your stool or on the toilet paper. If you underwent a bowel prep for your procedure, you may not have a normal bowel movement for a few days.  Please Note:  You might notice some irritation and congestion in your nose or some drainage.  This is from the oxygen used during your procedure.  There is no need for concern and it should clear up in a day or so.  SYMPTOMS TO REPORT IMMEDIATELY:  Following lower endoscopy (colonoscopy or flexible sigmoidoscopy):  Excessive amounts of blood in the  stool  Significant tenderness or worsening of abdominal pains  Swelling of the abdomen that is new, acute  Fever of 100F or higher   For urgent or emergent issues, a gastroenterologist can be reached at any hour by calling [phone]. Do not use MyChart messaging for urgent concerns.    DIET:  We do recommend a small meal at first, but then you may proceed to your regular diet.  Drink plenty of fluids but you should avoid alcoholic beverages for 24 hours.  ACTIVITY:  You should plan to take it easy for the rest of today and you should NOT DRIVE or use heavy machinery until tomorrow (because of the sedation medicines used during the test).    FOLLOW UP: Our staff will call the number listed on your records 48-72 hours following your procedure to check on you and address any questions or concerns that you may have regarding the information given to you following your procedure. If we do not reach you, we will leave a message.  We will attempt to reach you two times.  During this call, we will ask if you have developed any symptoms of COVID 19. If you develop any symptoms (ie: fever, flu-like symptoms, shortness of breath, cough etc.) before then, please call [phone].  If you test positive for Covid 19 in the 2 weeks post procedure,  please call and report this information to [place].    If any biopsies were taken you will be contacted by phone or by letter within the next 1-3 weeks.  Please call us at [phone] if you have not heard about the biopsies in 3 weeks.    SIGNATURES/CONFIDENTIALITY: You and/or your care partner have signed paperwork which will be entered into your electronic medical record.  These signatures attest to the fact that that the information above on your After Visit Summary has been reviewed and is understood.  Full responsibility of the confidentiality of this discharge information lies with you and/or your care-partner.

## 2020-10-05 NOTE — Progress Notes (Signed)
Cw v/s I have reviewed the patient's medical history in detail and updated the computerized patient record.

## 2020-10-05 NOTE — Progress Notes (Signed)
[name]   Primary Care Physician:  Sherrilee Gilles, DO   Reason for Procedure:   Hx colon polyps  Plan:    colonoscopy     HPI: Ray Shelton. is a 60 y.o. 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 Authorizing Provider  omeprazole (PRILOSEC) 40 MG capsule TAKE 1 CAPSULE TWICE A DAY BEFORE A MEAL 09/03/20  Yes Ladene Artist, MD    Current Outpatient Medications  Medication Sig Dispense Refill   omeprazole (PRILOSEC) 40 MG capsule TAKE 1 CAPSULE TWICE A DAY BEFORE A MEAL 180 capsule 0   Current Facility-Administered Medications  Medication Dose Route Frequency Provider Last Rate Last Admin   0.9 %  sodium chloride infusion  500 mL Intravenous Continuous Gatha Mayer, MD        Allergies as of 10/05/2020   (No Known Allergies)    Family History  Problem Relation Age of Onset   Dementia Father    Colon cancer Paternal Aunt    Colon cancer Maternal Grandfather    Colon polyps Neg Hx     Esophageal cancer Neg Hx    Rectal cancer Neg Hx    Stomach cancer Neg Hx     Social History   Socioeconomic History   Marital status: Married    Spouse name: Not on file   Number of children: Not on file   Years of education: Not on file   Highest education level: Not on file  Occupational History   Not on file  Tobacco Use   Smoking status: Never   Smokeless tobacco: Former  Scientific laboratory technician Use: Never used  Substance and Sexual Activity   Alcohol use: Yes    Alcohol/week: 7.0 standard drinks    Types: 7 Cans of beer per week    Comment: 1 beer a day    Drug use: Yes   Sexual activity: Not on file  Other Topics Concern   Not on file  Social History Narrative   Married   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  Physical Exam: Vital signs BP (!) 144/99   Pulse 84   Temp 98.4 F (36.9 C)   Resp 16   Ht '5\' 7"'$  (1.702 m)   Wt 185 lb (83.9 kg)   SpO2 99%   BMI 28.98 kg/m   General:   Alert,  Well-developed, well-nourished, pleasant and cooperative in NAD Lungs:  Clear throughout to auscultation.   Heart:  Regular rate and rhythm; no murmurs, clicks, rubs,  or gallops. Abdomen:  Soft, nontender and nondistended. Normal bowel sounds.  Small ventral hernia Neuro/Psych:  Alert and cooperative. Normal mood and affect. A and O x 3   '@Lupe Handley'$  Simonne Maffucci, MD, [facility] [phone] (pager) 10/05/2020 10:34 AM@

## 2020-10-09 ENCOUNTER — Telehealth: Payer: Self-pay

## 2020-10-09 NOTE — Telephone Encounter (Signed)
  Follow up Call-  Call back number 10/05/2020 04/07/2018  Post procedure Call Back phone  # [phone]  Permission to leave phone message Yes Yes  Some recent data might be hidden     Patient questions:  Do you have a fever, pain , or abdominal swelling? No. Pain Score  0 *  Have you tolerated food without any problems? Yes.    Have you been able to return to your normal activities? Yes.    Do you have any questions about your discharge instructions: Diet   No. Medications  No. Follow up visit  No.  Do you have questions or concerns about your Care? No.  Actions: * If pain score is 4 or above: No action needed, pain <4.

## 2020-10-11 ENCOUNTER — Encounter: Payer: Self-pay | Admitting: Internal Medicine

## 2020-10-11 DIAGNOSIS — Z86010 Personal history of colonic polyps: Secondary | ICD-10-CM

## 2020-12-10 ENCOUNTER — Other Ambulatory Visit: Payer: Self-pay | Admitting: Gastroenterology

## 2020-12-24 ENCOUNTER — Other Ambulatory Visit: Payer: Self-pay | Admitting: Internal Medicine

## 2021-11-18 ENCOUNTER — Other Ambulatory Visit: Payer: Self-pay | Admitting: Internal Medicine

## 2022-07-21 ENCOUNTER — Other Ambulatory Visit: Payer: Self-pay | Admitting: Internal Medicine

## 2022-10-20 ENCOUNTER — Other Ambulatory Visit: Payer: Self-pay | Admitting: Internal Medicine

## 2023-01-22 ENCOUNTER — Other Ambulatory Visit: Payer: Self-pay | Admitting: Internal Medicine

## 2023-05-05 ENCOUNTER — Other Ambulatory Visit: Payer: Self-pay | Admitting: Internal Medicine
